# Patient Record
Sex: Female | Born: 1970 | Race: White | Hispanic: No | Marital: Married | Smoking: Former smoker
Health system: Southern US, Community
[De-identification: ages and names within clinical notes are randomized; demographics above are authoritative.]

## PROBLEM LIST (undated history)

## (undated) DIAGNOSIS — N63 Unspecified lump in unspecified breast: Secondary | ICD-10-CM

## (undated) DIAGNOSIS — G43909 Migraine, unspecified, not intractable, without status migrainosus: Secondary | ICD-10-CM

## (undated) DIAGNOSIS — J40 Bronchitis, not specified as acute or chronic: Secondary | ICD-10-CM

## (undated) DIAGNOSIS — T8859XA Other complications of anesthesia, initial encounter: Secondary | ICD-10-CM

## (undated) DIAGNOSIS — Z9889 Other specified postprocedural states: Secondary | ICD-10-CM

## (undated) DIAGNOSIS — E669 Obesity, unspecified: Secondary | ICD-10-CM

## (undated) DIAGNOSIS — R0683 Snoring: Secondary | ICD-10-CM

## (undated) DIAGNOSIS — R112 Nausea with vomiting, unspecified: Secondary | ICD-10-CM

## (undated) DIAGNOSIS — T4145XA Adverse effect of unspecified anesthetic, initial encounter: Secondary | ICD-10-CM

## (undated) DIAGNOSIS — M542 Cervicalgia: Secondary | ICD-10-CM

## (undated) DIAGNOSIS — Z972 Presence of dental prosthetic device (complete) (partial): Secondary | ICD-10-CM

## (undated) DIAGNOSIS — R490 Dysphonia: Secondary | ICD-10-CM

## (undated) DIAGNOSIS — K760 Fatty (change of) liver, not elsewhere classified: Secondary | ICD-10-CM

## (undated) DIAGNOSIS — N3281 Overactive bladder: Secondary | ICD-10-CM

## (undated) DIAGNOSIS — K219 Gastro-esophageal reflux disease without esophagitis: Secondary | ICD-10-CM

## (undated) DIAGNOSIS — E785 Hyperlipidemia, unspecified: Secondary | ICD-10-CM

## (undated) DIAGNOSIS — E039 Hypothyroidism, unspecified: Secondary | ICD-10-CM

## (undated) DIAGNOSIS — D649 Anemia, unspecified: Secondary | ICD-10-CM

## (undated) DIAGNOSIS — G8929 Other chronic pain: Secondary | ICD-10-CM

## (undated) HISTORY — DX: Obesity, unspecified: E66.9

## (undated) HISTORY — DX: Hypothyroidism, unspecified: E03.9

## (undated) HISTORY — DX: Overactive bladder: N32.81

## (undated) HISTORY — DX: Gastro-esophageal reflux disease without esophagitis: K21.9

## (undated) HISTORY — PX: TUBAL LIGATION: SHX77

## (undated) HISTORY — PX: FOOT SURGERY: SHX648

## (undated) HISTORY — DX: Unspecified lump in unspecified breast: N63.0

## (undated) HISTORY — DX: Migraine, unspecified, not intractable, without status migrainosus: G43.909

## (undated) HISTORY — DX: Anemia, unspecified: D64.9

## (undated) HISTORY — DX: Hyperlipidemia, unspecified: E78.5

## (undated) HISTORY — DX: Other chronic pain: G89.29

## (undated) HISTORY — DX: Bronchitis, not specified as acute or chronic: J40

## (undated) HISTORY — DX: Fatty (change of) liver, not elsewhere classified: K76.0

## (undated) HISTORY — DX: Cervicalgia: M54.2

---

## 2001-11-21 ENCOUNTER — Encounter: Payer: Self-pay | Admitting: *Deleted

## 2001-11-21 ENCOUNTER — Emergency Department (HOSPITAL_COMMUNITY): Admission: EM | Admit: 2001-11-21 | Discharge: 2001-11-21 | Payer: Self-pay | Admitting: *Deleted

## 2001-11-28 ENCOUNTER — Encounter: Admission: RE | Admit: 2001-11-28 | Discharge: 2001-12-08 | Payer: Self-pay | Admitting: Family Medicine

## 2002-05-01 ENCOUNTER — Other Ambulatory Visit: Admission: RE | Admit: 2002-05-01 | Discharge: 2002-05-01 | Payer: Self-pay | Admitting: Family Medicine

## 2003-03-13 ENCOUNTER — Ambulatory Visit (HOSPITAL_COMMUNITY): Admission: RE | Admit: 2003-03-13 | Discharge: 2003-03-13 | Payer: Self-pay | Admitting: Family Medicine

## 2003-03-13 ENCOUNTER — Encounter: Payer: Self-pay | Admitting: Family Medicine

## 2004-06-30 ENCOUNTER — Ambulatory Visit: Payer: Self-pay | Admitting: Family Medicine

## 2004-07-04 ENCOUNTER — Encounter: Admission: RE | Admit: 2004-07-04 | Discharge: 2004-07-04 | Payer: Self-pay | Admitting: Family Medicine

## 2004-08-26 ENCOUNTER — Ambulatory Visit: Payer: Self-pay | Admitting: Family Medicine

## 2004-09-02 ENCOUNTER — Other Ambulatory Visit: Admission: RE | Admit: 2004-09-02 | Discharge: 2004-09-02 | Payer: Self-pay | Admitting: Internal Medicine

## 2004-09-02 ENCOUNTER — Ambulatory Visit: Payer: Self-pay | Admitting: Family Medicine

## 2004-11-05 ENCOUNTER — Ambulatory Visit: Payer: Self-pay | Admitting: Family Medicine

## 2005-04-13 ENCOUNTER — Ambulatory Visit: Payer: Self-pay | Admitting: Family Medicine

## 2005-04-16 ENCOUNTER — Encounter: Admission: RE | Admit: 2005-04-16 | Discharge: 2005-04-16 | Payer: Self-pay | Admitting: Family Medicine

## 2006-05-14 ENCOUNTER — Ambulatory Visit: Payer: Self-pay | Admitting: Family Medicine

## 2006-06-01 ENCOUNTER — Ambulatory Visit: Payer: Self-pay | Admitting: Family Medicine

## 2006-06-01 LAB — CONVERTED CEMR LAB
ALT: 21 units/L (ref 0–40)
AST: 27 units/L (ref 0–37)
BUN: 7 mg/dL (ref 6–23)
Basophils Relative: 0.9 % (ref 0.0–1.0)
Calcium: 9.9 mg/dL (ref 8.4–10.5)
Chloride: 107 meq/L (ref 96–112)
Cholesterol: 230 mg/dL (ref 0–200)
Eosinophil percent: 2.7 % (ref 0.0–5.0)
GFR calc non Af Amer: 75 mL/min
HCT: 40.7 % (ref 36.0–46.0)
HDL: 38.1 mg/dL — ABNORMAL LOW (ref >39.0)
Hemoglobin: 13.3 g/dL (ref 12.0–15.0)
Lymphocytes Relative: 41.6 % (ref 12.0–46.0)
MCV: 91.5 fL (ref 78.0–100.0)
Monocytes Absolute: 0.6 10*3/uL (ref 0.2–0.7)
Neutrophils Relative %: 44.8 % (ref 43.0–77.0)
RBC: 4.45 M/uL (ref 3.87–5.11)
Sodium: 143 meq/L (ref 135–145)
WBC: 5.8 10*3/uL (ref 4.5–10.5)

## 2006-06-08 ENCOUNTER — Encounter: Payer: Self-pay | Admitting: Family Medicine

## 2006-06-08 ENCOUNTER — Ambulatory Visit: Payer: Self-pay | Admitting: Family Medicine

## 2006-06-09 ENCOUNTER — Other Ambulatory Visit: Admission: RE | Admit: 2006-06-09 | Discharge: 2006-06-09 | Payer: Self-pay | Admitting: Family Medicine

## 2006-06-23 ENCOUNTER — Ambulatory Visit: Payer: Self-pay | Admitting: Family Medicine

## 2006-07-07 ENCOUNTER — Ambulatory Visit: Payer: Self-pay | Admitting: Family Medicine

## 2007-03-22 ENCOUNTER — Telehealth: Payer: Self-pay | Admitting: Family Medicine

## 2007-04-13 ENCOUNTER — Telehealth: Payer: Self-pay | Admitting: Family Medicine

## 2007-08-18 HISTORY — PX: CHOLECYSTECTOMY: SHX55

## 2007-10-03 ENCOUNTER — Emergency Department (HOSPITAL_COMMUNITY): Admission: EM | Admit: 2007-10-03 | Discharge: 2007-10-03 | Payer: Self-pay | Admitting: Emergency Medicine

## 2007-10-06 ENCOUNTER — Ambulatory Visit: Payer: Self-pay | Admitting: Family Medicine

## 2007-10-06 DIAGNOSIS — E039 Hypothyroidism, unspecified: Secondary | ICD-10-CM | POA: Insufficient documentation

## 2007-10-06 DIAGNOSIS — S139XXA Sprain of joints and ligaments of unspecified parts of neck, initial encounter: Secondary | ICD-10-CM

## 2007-10-06 DIAGNOSIS — E785 Hyperlipidemia, unspecified: Secondary | ICD-10-CM

## 2007-10-11 ENCOUNTER — Encounter: Admission: RE | Admit: 2007-10-11 | Discharge: 2007-11-14 | Payer: Self-pay | Admitting: Family Medicine

## 2007-11-14 ENCOUNTER — Encounter: Payer: Self-pay | Admitting: Family Medicine

## 2007-12-01 ENCOUNTER — Telehealth: Payer: Self-pay | Admitting: Family Medicine

## 2008-01-24 ENCOUNTER — Telehealth: Payer: Self-pay | Admitting: Family Medicine

## 2008-01-31 ENCOUNTER — Telehealth: Payer: Self-pay | Admitting: Family Medicine

## 2008-04-25 ENCOUNTER — Ambulatory Visit: Payer: Self-pay | Admitting: Family Medicine

## 2008-04-25 DIAGNOSIS — M545 Low back pain: Secondary | ICD-10-CM

## 2008-04-26 ENCOUNTER — Telehealth: Payer: Self-pay | Admitting: Family Medicine

## 2008-04-26 ENCOUNTER — Encounter: Admission: RE | Admit: 2008-04-26 | Discharge: 2008-04-26 | Payer: Self-pay | Admitting: Family Medicine

## 2008-06-04 ENCOUNTER — Telehealth: Payer: Self-pay | Admitting: Family Medicine

## 2008-06-08 ENCOUNTER — Telehealth: Payer: Self-pay | Admitting: Family Medicine

## 2008-06-11 ENCOUNTER — Ambulatory Visit: Payer: Self-pay | Admitting: Family Medicine

## 2008-08-06 ENCOUNTER — Telehealth: Payer: Self-pay | Admitting: *Deleted

## 2008-08-15 ENCOUNTER — Telehealth: Payer: Self-pay | Admitting: Family Medicine

## 2008-10-10 ENCOUNTER — Ambulatory Visit: Payer: Self-pay | Admitting: Family Medicine

## 2008-10-12 ENCOUNTER — Telehealth: Payer: Self-pay | Admitting: Family Medicine

## 2008-10-17 ENCOUNTER — Telehealth: Payer: Self-pay | Admitting: Family Medicine

## 2008-10-26 ENCOUNTER — Telehealth: Payer: Self-pay | Admitting: Family Medicine

## 2008-11-22 ENCOUNTER — Telehealth: Payer: Self-pay | Admitting: Family Medicine

## 2008-11-28 ENCOUNTER — Encounter: Payer: Self-pay | Admitting: Family Medicine

## 2009-01-11 ENCOUNTER — Telehealth: Payer: Self-pay | Admitting: Family Medicine

## 2009-03-25 ENCOUNTER — Telehealth: Payer: Self-pay | Admitting: Family Medicine

## 2009-05-28 ENCOUNTER — Ambulatory Visit: Payer: Self-pay | Admitting: Family Medicine

## 2009-05-31 ENCOUNTER — Ambulatory Visit: Payer: Self-pay | Admitting: Family Medicine

## 2009-05-31 DIAGNOSIS — N959 Unspecified menopausal and perimenopausal disorder: Secondary | ICD-10-CM | POA: Insufficient documentation

## 2009-05-31 DIAGNOSIS — N318 Other neuromuscular dysfunction of bladder: Secondary | ICD-10-CM | POA: Insufficient documentation

## 2009-05-31 LAB — CONVERTED CEMR LAB
Bilirubin Urine: NEGATIVE
Ketones, urine, test strip: NEGATIVE
Specific Gravity, Urine: <1.005
Urobilinogen, UA: 0.2

## 2009-06-21 ENCOUNTER — Telehealth: Payer: Self-pay | Admitting: Family Medicine

## 2009-07-15 ENCOUNTER — Ambulatory Visit: Payer: Self-pay | Admitting: Family Medicine

## 2009-07-17 LAB — CONVERTED CEMR LAB
Albumin: 3.8 g/dL (ref 3.5–5.2)
Alkaline Phosphatase: 72 units/L (ref 39–117)
Basophils Absolute: 0.1 10*3/uL (ref 0.0–0.1)
Bilirubin, Direct: 0 mg/dL (ref 0.0–0.3)
CO2: 25 meq/L (ref 19–32)
Calcium: 9.3 mg/dL (ref 8.4–10.5)
Cholesterol: 248 mg/dL — ABNORMAL HIGH (ref 0–200)
Creatinine, Ser: 0.8 mg/dL (ref 0.4–1.2)
Eosinophils Absolute: 0.2 10*3/uL (ref 0.0–0.7)
Glucose, Bld: 85 mg/dL (ref 70–99)
Lymphocytes Relative: 39.4 % (ref 12.0–46.0)
MCHC: 33.3 g/dL (ref 30.0–36.0)
Neutrophils Relative %: 47.1 % (ref 43.0–77.0)
Platelets: 265 10*3/uL (ref 150.0–400.0)
RDW: 13.9 % (ref 11.5–14.6)
Total CHOL/HDL Ratio: 6
Triglycerides: 222 mg/dL — ABNORMAL HIGH (ref 0.0–149.0)

## 2009-07-19 LAB — CONVERTED CEMR LAB
FSH: 8.1 milliintl units/mL
LH: 4.81 milliintl units/mL

## 2009-07-24 ENCOUNTER — Ambulatory Visit: Payer: Self-pay | Admitting: Family Medicine

## 2009-07-24 ENCOUNTER — Other Ambulatory Visit: Admission: RE | Admit: 2009-07-24 | Discharge: 2009-07-24 | Payer: Self-pay | Admitting: Family Medicine

## 2009-07-24 DIAGNOSIS — E663 Overweight: Secondary | ICD-10-CM | POA: Insufficient documentation

## 2009-07-24 DIAGNOSIS — N63 Unspecified lump in unspecified breast: Secondary | ICD-10-CM

## 2009-07-29 ENCOUNTER — Encounter: Payer: Self-pay | Admitting: Family Medicine

## 2009-07-31 ENCOUNTER — Encounter: Admission: RE | Admit: 2009-07-31 | Discharge: 2009-07-31 | Payer: Self-pay | Admitting: Family Medicine

## 2009-08-08 ENCOUNTER — Telehealth: Payer: Self-pay | Admitting: Family Medicine

## 2009-10-18 ENCOUNTER — Telehealth: Payer: Self-pay | Admitting: Family Medicine

## 2009-10-29 ENCOUNTER — Telehealth: Payer: Self-pay | Admitting: Family Medicine

## 2009-11-22 ENCOUNTER — Telehealth: Payer: Self-pay

## 2009-11-27 ENCOUNTER — Telehealth: Payer: Self-pay | Admitting: Family Medicine

## 2010-03-17 ENCOUNTER — Telehealth: Payer: Self-pay | Admitting: Family Medicine

## 2010-05-05 ENCOUNTER — Encounter: Admission: RE | Admit: 2010-05-05 | Discharge: 2010-05-05 | Payer: Self-pay | Admitting: Internal Medicine

## 2010-06-12 ENCOUNTER — Telehealth: Payer: Self-pay | Admitting: Family Medicine

## 2010-09-16 NOTE — Progress Notes (Signed)
Summary: refill hydrocodone  Phone Note From Pharmacy   [facility] [address] [phone]* Call For: Adora Yeh  Summary of Call: refill hydrocodone 5/500 1 by mouth every 6 hours as needed for pain Initial call taken by: Alfred Levins, CMA,  October 18, 2009 3:26 PM  Follow-up for Phone Call        call in #60 with 2 rf Follow-up by: Nelwyn Salisbury MD,  October 18, 2009 4:10 PM  Additional Follow-up for Phone Call Additional follow up Details #1::        Phone call completed, Pharmacist called Additional Follow-up by: Alfred Levins, CMA,  October 18, 2009 4:13 PM    Prescriptions: VICODIN 5-500 MG  TABS (HYDROCODONE-ACETAMINOPHEN) 1 every 6 hours as needed for pain  #60 x 2   Entered by:   Alfred Levins, CMA   Authorized by:   Nelwyn Salisbury MD   Signed by:   Alfred Levins, CMA on 10/18/2009   Method used:   Telephoned to ...       [facility] [address] [phone]* (retail)       [address]       [place], [place]  [address]       Ph: [phone] or [phone]       Fax: [phone]   RxID:   [id number]

## 2010-09-16 NOTE — Progress Notes (Signed)
Summary:  stronger med  Phone Note Call from Patient Call back at Home Phone [phone]   Caller: Mom Call For: Nelwyn Salisbury MD Summary of Call: pt would like something stronger than vicodin for hip/arm/elbow pain [facility] [phone] Initial call taken by: Heron Sabins,  October 29, 2009 12:45 PM  Follow-up for Phone Call        No I cannot do that. Since her joints are bothering her so much, I suggest refering her to Rheumatology if she wishes Follow-up by: Nelwyn Salisbury MD,  October 29, 2009 3:21 PM  Additional Follow-up for Phone Call Additional follow up Details #1::        called and LMOM. Additional Follow-up by: Raechel Ache, RN,  October 29, 2009 3:48 PM

## 2010-09-16 NOTE — Progress Notes (Signed)
Summary: refill hydrocodone   Phone Note From Pharmacy   Call[facility] [address] [phone]* Summary of Call: refill hydrocodone 5-500mg   Initial call taken by: Pura Spice, RN,  June 12, 2010 2:36 PM  Follow-up for Phone Call        call in #60 with 2 rf Follow-up by: Nelwyn Salisbury MD,  June 13, 2010 1:12 PM  Additional Follow-up for Phone Call Additional follow up Details #1::        done Additional Follow-up by: Pura Spice, RN,  June 13, 2010 1:21 PM    Prescriptions: VICODIN 5-500 MG  TABS (HYDROCODONE-ACETAMINOPHEN) 1 every 6 hours as needed for pain  #60 x 2   Entered by:   Pura Spice, RN   Authorized by:   Nelwyn Salisbury MD   Signed by:   Pura Spice, RN on 06/13/2010   Method used:   Telephoned to ...       [facility] [address] [phone]* (retail)       [address]       [place], [place]  [address]       Ph: [phone] or [phone]       Fax: [phone]   RxID:   [id number]

## 2010-09-16 NOTE — Progress Notes (Signed)
Summary: refill Vicodin  Phone Note From Pharmacy Message from:  Fax from Pharmacy on November 27, 2009 4:40 PM  Refills Requested: Medication #1:  VICODIN 5-500 MG  TABS 1 every 6 hours as needed for pain   Dosage confirmed as above?Dosage Confirmed   Supply Requested: 1 month   Last Refilled: 11/15/2009  Method Requested: Telephone to Pharmacy Initial call taken by: Raechel Ache, RN,  November 27, 2009 4:41 PM [facility] [address] [phone]* Summary of Call: No, I gave her #60 with 2 rf just six weeks ago Initial call taken by: Nelwyn Salisbury MD,  November 27, 2009 5:15 PM  Follow-up for Phone Call        pharmacy notified. Follow-up by: Raechel Ache, RN,  November 28, 2009 8:02 AM

## 2010-09-16 NOTE — Progress Notes (Signed)
Summary: Pt has questions re: Phentermine dosage amounts  Phone Note Call from Patient Call back at Home Phone [phone] Call back at call before 1:45pm or leave a message   Caller: Patient Summary of Call: Pt called and said that she has been getting her Phentermine script filled at [facility] [address] Pt says that the mg dose of the med that [facility] has been giving her is 37.5 mg and it has been tablets. Today when she went to pick up refill at [facility], they gave her a 30mg  capsule. Pt is concerned about the changes in dosage amount and form of pill.   Initial call taken by: Lucy Antigua,  November 22, 2009 12:02 PM  Follow-up for Phone Call        attempt to call - ans mach - LMTCB if questions - rx was written for 30mg  - check with pharm about where 37.5 mg came from. KIK Follow-up by: Duard Brady LPN,  November 22, 2009 12:17 PM

## 2010-09-16 NOTE — Progress Notes (Signed)
Summary: RX refill  Phone Note Refill Request Message from:  Fax from Pharmacy on March 17, 2010 1:24 PM  Refills Requested: Medication #1:  VICODIN 5-500 MG  TABS 1 every 6 hours as needed for pain   Dosage confirmed as above?Dosage Confirmed Please advise refill?  Initial call taken by: Josph Macho RMA,  March 17, 2010 1:24 PM  Follow-up for Phone Call        call in #60 with 2 rf Follow-up by: Nelwyn Salisbury MD,  March 18, 2010 1:51 PM    Prescriptions: VICODIN 5-500 MG  TABS (HYDROCODONE-ACETAMINOPHEN) 1 every 6 hours as needed for pain  #60 x 2   Entered by:   Josph Macho RMA   Authorized by:   Nelwyn Salisbury MD   Signed by:   Josph Macho RMA on 03/18/2010   Method used:   Telephoned to ...       [facility] [address] [phone]* (retail)       [address]       [place], [place]  [address]       Ph: [phone] or [phone]       Fax: [phone]   RxID:   [id number]

## 2010-09-16 NOTE — Progress Notes (Signed)
Summary: REQ FOR REFILL  Phone Note Call from Patient   Caller: Patient Reason for Call: Refill Medication Summary of Call: PT CALLED TO REQ A REFILL FOR MED (VICODIN / HYDROCODONE)....  ADV THAT IF YOU HAVE ANY QUESTIONS OR CONCERNS, YOU CAN REACH HER @ [phone]. // RS Initial call taken by: Debbra Riding,  June 21, 2009 8:57 AM  Follow-up for Phone Call        call in #60 with 2 rf Follow-up by: Nelwyn Salisbury MD,  June 21, 2009 10:29 AM  Additional Follow-up for Phone Call Additional follow up Details #1::        Phone Call Completed, Rx Called In Additional Follow-up by: Alfred Levins, CMA,  June 21, 2009 1:59 PM    Prescriptions: VICODIN 5-500 MG  TABS (HYDROCODONE-ACETAMINOPHEN) 1 every 6 hours as needed for pain  #60 x 2   Entered by:   Alfred Levins, CMA   Authorized by:   Nelwyn Salisbury MD   Signed by:   Alfred Levins, CMA on 06/21/2009   Method used:   Telephoned to ...       [facility]  [facility] [address] [phone]* (retail)       [address]       [place], [place]  [address]       Ph: [phone] or [phone]       Fax: [phone]   RxID:   [id number]

## 2010-09-23 ENCOUNTER — Other Ambulatory Visit: Payer: Self-pay | Admitting: Family Medicine

## 2010-09-23 DIAGNOSIS — R52 Pain, unspecified: Secondary | ICD-10-CM

## 2010-09-23 NOTE — Telephone Encounter (Signed)
Called to [facility] 

## 2010-09-23 NOTE — Telephone Encounter (Signed)
Call in #30 with 5 rf 

## 2010-11-11 ENCOUNTER — Other Ambulatory Visit: Payer: Self-pay | Admitting: Family Medicine

## 2010-11-12 NOTE — Telephone Encounter (Signed)
No, I gave her #30 with 5 rf last month. She should not be using more than 30 in a month

## 2010-11-12 NOTE — Telephone Encounter (Signed)
[facility] notified  Of denial of med.

## 2010-11-17 ENCOUNTER — Other Ambulatory Visit: Payer: Self-pay

## 2010-11-17 DIAGNOSIS — R52 Pain, unspecified: Secondary | ICD-10-CM

## 2010-11-17 NOTE — Telephone Encounter (Signed)
Requesting refills vicodin 5-500 [facility]

## 2010-11-18 NOTE — Telephone Encounter (Signed)
rx request for vicodin denied  And [facility]ed [facility]

## 2010-11-18 NOTE — Telephone Encounter (Signed)
She needs an OV first. I have not seen her for well over a year

## 2010-11-25 ENCOUNTER — Other Ambulatory Visit: Payer: Self-pay | Admitting: Family Medicine

## 2010-12-08 ENCOUNTER — Other Ambulatory Visit: Payer: Self-pay | Admitting: Family Medicine

## 2010-12-15 ENCOUNTER — Other Ambulatory Visit: Payer: Self-pay | Admitting: Family Medicine

## 2010-12-16 ENCOUNTER — Other Ambulatory Visit: Payer: Self-pay | Admitting: Family Medicine

## 2010-12-16 NOTE — Telephone Encounter (Signed)
Pt called and said that she no longer has insurance and pts husband just started a new job. Pt does not have money to come in for ov. Pt is req a refill of Hydrocodone 5-500 mg.  [facility] [address]

## 2010-12-17 ENCOUNTER — Telehealth: Payer: Self-pay | Admitting: Family Medicine

## 2010-12-17 NOTE — Telephone Encounter (Signed)
LMOM explaining that we are aware of her situation [no insurance], but unfortunately we cannot prescribe Rx; especially of controlled substances, for Pts unless they have had an OV w/i the past year. Advised Pt that she needs to call and schedule an OV for med assessment. Left contact name & number for callback.

## 2010-12-17 NOTE — Telephone Encounter (Signed)
As noted in the chart, we cannot do this without an OV. The patient is aware

## 2011-01-02 NOTE — Assessment & Plan Note (Signed)
[facility] OFFICE NOTE   Marie, Tran                   MRN:          [id number]  DATE:06/08/2006                            DOB:          1971-02-10    This is a 40 year old woman here for a complete physical examination.  In  general, she is doing fairly well.  Last month when I saw her, I gave her a  prescription for Meridia to help her lose weight.  She said this was too  expensive and decided not to use it.  Instead, she has been walking and  trying to diet responsibly.  Otherwise, no particular issues.   PAST MEDICAL HISTORY:  Includes hypothyroidism and intermittent back pains.  She did have her initial screening mammogram in November of 2005, and this  was normal.  For details of her past medical history, family history, social  history, habits, Melvern Banker, refer to her last physical note dated September 02, 2004.   ALLERGIES:  None.   CURRENT MEDICATIONS:  1. Synthroid 125 mcg per day.  2. Vitamin E daily.  3. Hydrochlorothiazide 25 mg once a day as needed for edema.  4. Vicodin 5/500 as needed for back pain.   OBJECTIVE:  Height 5 feet 5 inches.  Weight 250 pounds (which is down 4  pounds form our last visit).  Pulse 76 and regular.  BP 102/72.  GENERAL:  She is quite obese.  SKIN:  Clear.  EYES:  Clear.  Sclerae clear.  OROPHARYNX:  Clear.  NECK:  Supple without lymphadenopathy or masses.  LUNGS:  Clear.  CARDIAC:  Rate and rhythm are regular without gallops, murmurs, or rubs.  Distal pulses full.  BREASTS AND AXILLAE:  Clear.  ABDOMEN:  Soft.  Normal bowel sounds.  Nontender.  No masses.  PELVIC:  External genitalia within normal limits.  Vaginal clear.  Cervix is  clear.  Pap smear is obtained.  Uterus is not enlarged.  No adnexal mass or  tenderness.  EXTREMITIES:  No cyanosis, clubbing, or edema.  NEUROLOGIC:  Grossly intact.   She was here for fasting labs on October 16.   This is remarkable for a  normal TSH but an abnormal lipid panel as usual.  Total cholesterol is high  at 230.  Triglycerides high at 162.  LDL is high at 163 with HDL low at 38.   ASSESSMENT AND PLAN:  1. Complete physical.  We talked about continuing her diet and exercise      plan to lose weight.  2. Hypothyroidism, stable.  3. Hyperlipidemia.  Will begin simvastatin 40 mg once daily and will check      a lipid panel again in 3 months.            ______________________________  Tera Mater. Clent Ridges, MD     SAF/MedQ  DD:  06/08/2006  DT:  06/09/2006  Job #:  [id number]

## 2011-01-22 ENCOUNTER — Encounter: Payer: Self-pay | Admitting: Family Medicine

## 2011-01-22 ENCOUNTER — Ambulatory Visit (INDEPENDENT_AMBULATORY_CARE_PROVIDER_SITE_OTHER): Payer: Self-pay | Admitting: Family Medicine

## 2011-01-22 VITALS — BP 128/92 | HR 95 | Ht 64.0 in | Wt 254.0 lb

## 2011-01-22 DIAGNOSIS — M5440 Lumbago with sciatica, unspecified side: Secondary | ICD-10-CM

## 2011-01-22 DIAGNOSIS — M5430 Sciatica, unspecified side: Secondary | ICD-10-CM

## 2011-01-22 MED ORDER — OXYCODONE-ACETAMINOPHEN 10-325 MG PO TABS: 1.0000 | ORAL_TABLET | Freq: Four times a day (QID) | ORAL | Status: DC | PRN

## 2011-01-22 NOTE — Progress Notes (Signed)
  Subjective:    Patient ID: Marie Tran, female    DOB: 29-Nov-1970, 40 y.o.   MRN: [id number]  HPI Here for worsening low back pain. We got an MRI of the lumbar spine in 2009 showing multilevel disc herniations but no obvious nerve impingement. She has dealt with this since then with Vicodin and heat. In the past 3 months however the pain has become more severe, and she has radiation of severe pain down the right leg to the foot. The right leg also has weakness and numbness now.    Review of Systems  Constitutional: Negative.   Musculoskeletal: Positive for back pain.       Objective:   Physical Exam  Constitutional:       In pain, walks with a limp  Musculoskeletal:       Very tender in the lower back an dover the right sciatic notch. Positive SLR bilaterally          Assessment & Plan:  Low back pain, probably due to disc herniations. Try Percocet for pain. Her medical insurance (she is on her husband's plan) will become effective in 30 days. We will plan to set up another lumbar MRI after this kicks in.

## 2011-01-28 ENCOUNTER — Other Ambulatory Visit: Payer: Self-pay | Admitting: Family Medicine

## 2011-02-05 NOTE — Telephone Encounter (Signed)
Call in #30 with 5 rf 

## 2011-02-06 MED ORDER — PHENTERMINE HCL 30 MG PO CAPS: 30.0000 mg | ORAL_CAPSULE | Freq: Every day | ORAL | Status: DC

## 2011-02-09 ENCOUNTER — Ambulatory Visit (INDEPENDENT_AMBULATORY_CARE_PROVIDER_SITE_OTHER): Payer: Self-pay | Admitting: Family Medicine

## 2011-02-09 DIAGNOSIS — Z0000 Encounter for general adult medical examination without abnormal findings: Secondary | ICD-10-CM

## 2011-02-17 ENCOUNTER — Other Ambulatory Visit: Payer: Self-pay | Admitting: Family Medicine

## 2011-02-17 NOTE — Telephone Encounter (Signed)
Call in #60 with 5 rf 

## 2011-02-19 ENCOUNTER — Telehealth: Payer: Self-pay | Admitting: *Deleted

## 2011-02-19 NOTE — Telephone Encounter (Signed)
Rx called in 

## 2011-02-19 NOTE — Telephone Encounter (Signed)
Refill on vicodin 5/500 last filled on 10/27/2010 LOV 01/22/11

## 2011-02-20 MED ORDER — HYDROCODONE-ACETAMINOPHEN 5-500 MG PO TABS: 5.0000 | ORAL_TABLET | Freq: Four times a day (QID) | ORAL | Status: DC | PRN

## 2011-02-20 NOTE — Telephone Encounter (Signed)
Call in #120 with no rf 

## 2011-04-09 ENCOUNTER — Encounter: Payer: Self-pay | Admitting: Family Medicine

## 2011-04-09 ENCOUNTER — Ambulatory Visit (INDEPENDENT_AMBULATORY_CARE_PROVIDER_SITE_OTHER): Payer: Self-pay | Admitting: Family Medicine

## 2011-04-09 VITALS — BP 122/78 | HR 93 | Temp 98.6°F | Wt 242.0 lb

## 2011-04-09 DIAGNOSIS — J40 Bronchitis, not specified as acute or chronic: Secondary | ICD-10-CM

## 2011-04-09 MED ORDER — AZITHROMYCIN 250 MG PO TABS: ORAL_TABLET | ORAL | Status: AC

## 2011-04-09 MED ORDER — HYDROCODONE-HOMATROPINE 5-1.5 MG/5ML PO SYRP: 5.0000 mL | ORAL_SOLUTION | ORAL | Status: AC | PRN

## 2011-04-09 NOTE — Progress Notes (Signed)
  Subjective:    Patient ID: Marie Tran, female    DOB: 29-Aug-1970, 40 y.o.   MRN: [id number]  HPI Here for 10 days of sinus pressure, PND, chest congestion, and coughing up yellow sputum. No fever. On Mucinex   Review of Systems  Constitutional: Negative.   HENT: Positive for congestion, postnasal drip and sinus pressure.   Eyes: Negative.   Respiratory: Positive for cough.        Objective:   Physical Exam  Constitutional: She appears well-developed and well-nourished.  HENT:  Right Ear: External ear normal.  Left Ear: External ear normal.  Nose: Nose normal.  Mouth/Throat: Oropharynx is clear and moist. No oropharyngeal exudate.  Eyes: Conjunctivae are normal. Pupils are equal, round, and reactive to light.  Neck: Normal range of motion. Neck supple. No thyromegaly present.  Pulmonary/Chest: Effort normal. No respiratory distress. She has no wheezes. She has no rales. She exhibits no tenderness.       Diffuse rhonchi   Lymphadenopathy:    She has no cervical adenopathy.          Assessment & Plan:  Rest and drink fluids

## 2011-05-07 ENCOUNTER — Telehealth: Payer: Self-pay | Admitting: Family Medicine

## 2011-05-07 NOTE — Telephone Encounter (Signed)
Refill request for Hydrocodone/Acetaminophen 5-500 mg take 1 po q6hrs prn. Pt last here on 01/22/11 and script last filled on 06/25/10.

## 2011-05-08 MED ORDER — HYDROCODONE-ACETAMINOPHEN 5-500 MG PO TABS: 5.0000 | ORAL_TABLET | Freq: Four times a day (QID) | ORAL | Status: DC | PRN

## 2011-05-08 NOTE — Telephone Encounter (Signed)
Call in #120 with 2 rf 

## 2011-05-08 NOTE — Telephone Encounter (Signed)
rx called into pharmacy

## 2011-07-30 ENCOUNTER — Telehealth: Payer: Self-pay | Admitting: Family Medicine

## 2011-07-30 ENCOUNTER — Other Ambulatory Visit: Payer: Self-pay | Admitting: Family Medicine

## 2011-07-30 NOTE — Telephone Encounter (Signed)
Mass was found in pt right breast in December 2010 it was confirmed with an [place] and pt is currently experiencing pains in her breast and would like to be referred for a mammogram. Pt contacted [facility] but was told she could not set up directly and need to be referred.

## 2011-07-30 NOTE — Telephone Encounter (Signed)
She needs an OV. I need to examine her breast before I can send such an order

## 2011-07-30 NOTE — Telephone Encounter (Signed)
lmom for pt to call back and schedule

## 2011-08-04 ENCOUNTER — Encounter: Payer: Self-pay | Admitting: Family Medicine

## 2011-08-04 ENCOUNTER — Ambulatory Visit (INDEPENDENT_AMBULATORY_CARE_PROVIDER_SITE_OTHER): Payer: BC Managed Care – PPO | Admitting: Family Medicine

## 2011-08-04 VITALS — BP 132/86 | HR 94 | Temp 98.6°F | Wt 244.0 lb

## 2011-08-04 DIAGNOSIS — N760 Acute vaginitis: Secondary | ICD-10-CM

## 2011-08-04 DIAGNOSIS — M545 Low back pain, unspecified: Secondary | ICD-10-CM

## 2011-08-04 DIAGNOSIS — N63 Unspecified lump in unspecified breast: Secondary | ICD-10-CM

## 2011-08-04 DIAGNOSIS — L739 Follicular disorder, unspecified: Secondary | ICD-10-CM

## 2011-08-04 DIAGNOSIS — B9689 Other specified bacterial agents as the cause of diseases classified elsewhere: Secondary | ICD-10-CM

## 2011-08-04 DIAGNOSIS — L738 Other specified follicular disorders: Secondary | ICD-10-CM

## 2011-08-04 DIAGNOSIS — A499 Bacterial infection, unspecified: Secondary | ICD-10-CM

## 2011-08-04 MED ORDER — METRONIDAZOLE 500 MG PO TABS: 500.0000 mg | ORAL_TABLET | Freq: Three times a day (TID) | ORAL | Status: AC

## 2011-08-04 NOTE — Progress Notes (Signed)
  Subjective:    Patient ID: Marie Tran, female    DOB: May 31, 1971, 40 y.o.   MRN: [id number]  HPI Here for several reasons. First she needs another mammogram and [place] to follow up a right breast lump she has had for several years. This was last done on 07-21-09, and it was revealed to be an intramammary lymph node. Due to a lack of insurance she has not followed up since then. Now she has insurance again and she wants to get back on track. The lump gets painful from time to time, and she is interested in having it removed. Second, for about 3 months she has had a recurrent vaginal DC which has a yellow color and a foul odor. No pain or itching. She uses a douche from time to time. Lastly she has an irritated rash at the hairline on the back of her head that comes and goes.    Review of Systems  Constitutional: Negative.   Genitourinary: Positive for vaginal discharge. Negative for vaginal bleeding and vaginal pain.       Objective:   Physical Exam  Constitutional: She appears well-developed and well-nourished.  Genitourinary:       The right breast has a tender lump at the 11 o'clock position. No axillary masses. The left breast is clear.   Skin:       The posterior hairline has a red maculopapular rash           Assessment & Plan:  We will set up a bilateral mammogram and right breast US soon. After that we may refer her to Surgery to consider a lumpectomy. Treat the bacterial vaginitis with Flagyl. This may treat the folliculitis on the scalp as well. She is set for a cpx and a pelvic exam next February.

## 2011-08-06 ENCOUNTER — Telehealth: Payer: Self-pay

## 2011-08-06 MED ORDER — PROMETHAZINE HCL 25 MG PO TABS: 25.0000 mg | ORAL_TABLET | Freq: Three times a day (TID) | ORAL | Status: DC | PRN

## 2011-08-06 MED ORDER — PROMETHAZINE HCL 25 MG PO TABS: 25.0000 mg | ORAL_TABLET | ORAL | Status: AC | PRN

## 2011-08-06 NOTE — Telephone Encounter (Signed)
Script called in, tried to call pt no answer. 

## 2011-08-06 NOTE — Telephone Encounter (Signed)
Call in Phenergan 25 mg tabs q 4 hours prn nausea, #60 with 2 rf

## 2011-08-06 NOTE — Telephone Encounter (Signed)
Pt states that every one in her house has the flu and she is experiencing some nausea.  [facility] [address]  Pls advise.

## 2011-08-07 ENCOUNTER — Ambulatory Visit
Admission: RE | Admit: 2011-08-07 | Discharge: 2011-08-07 | Disposition: A | Discharge status: Home / Self Care | Payer: BC Managed Care – PPO | Source: Ambulatory Visit | Attending: Family Medicine | Admitting: Family Medicine

## 2011-08-07 DIAGNOSIS — M545 Low back pain: Secondary | ICD-10-CM

## 2011-08-14 ENCOUNTER — Telehealth: Payer: Self-pay

## 2011-08-14 NOTE — Telephone Encounter (Signed)
Triage VM: Pt had the flu going around on Christmas in the household. Pt states she now has a cough and needs an rx sent to pharmacy.    Called pt to get more information and to make aware Dr. Clent Ridges is out of the office until 08/17/11. Left a message for return call.

## 2011-08-14 NOTE — Telephone Encounter (Signed)
Refill request for Vicodin 5-500 mg take 1 po q6hrs prn and pt last here 08/04/11.

## 2011-08-19 ENCOUNTER — Ambulatory Visit
Admission: RE | Admit: 2011-08-19 | Discharge: 2011-08-19 | Disposition: A | Discharge status: Home / Self Care | Payer: BC Managed Care – PPO | Source: Ambulatory Visit | Attending: Family Medicine | Admitting: Family Medicine

## 2011-08-19 ENCOUNTER — Other Ambulatory Visit: Payer: Self-pay | Admitting: Family Medicine

## 2011-08-19 DIAGNOSIS — N63 Unspecified lump in unspecified breast: Secondary | ICD-10-CM

## 2011-08-19 NOTE — Telephone Encounter (Signed)
Call in #120 with 2 rf 

## 2011-08-20 MED ORDER — HYDROCODONE-ACETAMINOPHEN 5-500 MG PO TABS: 5.0000 | ORAL_TABLET | Freq: Four times a day (QID) | ORAL | Status: DC | PRN

## 2011-08-20 NOTE — Telephone Encounter (Signed)
Script called in

## 2011-08-24 ENCOUNTER — Telehealth: Payer: Self-pay | Admitting: Family Medicine

## 2011-08-24 NOTE — Telephone Encounter (Signed)
Had a mammogram and diagnostic ultrasound. Please advise of the results. They did find a lump. Thanks.

## 2011-08-24 NOTE — Telephone Encounter (Signed)
She needs to get these results from the facility that did them

## 2011-08-25 NOTE — Telephone Encounter (Signed)
Sorry Dr Clent Ridges. She did get the results. She just want to know from you, what would be the next step? She stated that mom had a history of breast cancer. Thanks.

## 2011-08-25 NOTE — Telephone Encounter (Signed)
I did review the results and the lump seems to be a simple cyst. I suggest she get a repeat mammogram and [place] in 6 months (as they suggested)

## 2011-08-26 NOTE — Telephone Encounter (Signed)
Spoke with pt and she will discuss further when she comes in for her office visit.

## 2011-09-18 ENCOUNTER — Other Ambulatory Visit (INDEPENDENT_AMBULATORY_CARE_PROVIDER_SITE_OTHER): Payer: BC Managed Care – PPO

## 2011-09-18 DIAGNOSIS — Z0000 Encounter for general adult medical examination without abnormal findings: Secondary | ICD-10-CM

## 2011-09-18 LAB — CBC WITH DIFFERENTIAL/PLATELET
Eosinophils Relative: 1.9 % (ref 0.0–5.0)
Lymphocytes Relative: 40.7 % (ref 12.0–46.0)
MCV: 83 fl (ref 78.0–100.0)
Monocytes Absolute: 0.6 10*3/uL (ref 0.1–1.0)
Monocytes Relative: 9.2 % (ref 3.0–12.0)
Neutrophils Relative %: 47.6 % (ref 43.0–77.0)
Platelets: 302 10*3/uL (ref 150.0–400.0)
RBC: 4.44 Mil/uL (ref 3.87–5.11)
WBC: 6.6 10*3/uL (ref 4.5–10.5)

## 2011-09-18 LAB — TSH: TSH: 0.04 u[IU]/mL — ABNORMAL LOW (ref 0.35–5.50)

## 2011-09-18 LAB — POCT URINALYSIS DIPSTICK
Blood, UA: NEGATIVE
Glucose, UA: NEGATIVE
Nitrite, UA: NEGATIVE
Urobilinogen, UA: 0.2
pH, UA: 6

## 2011-09-18 LAB — BASIC METABOLIC PANEL
BUN: 8 mg/dL (ref 6–23)
Calcium: 9.4 mg/dL (ref 8.4–10.5)
Creatinine, Ser: 0.7 mg/dL (ref 0.4–1.2)
GFR: 97.83 mL/min (ref >60.00)

## 2011-09-18 LAB — LIPID PANEL
Cholesterol: 211 mg/dL — ABNORMAL HIGH (ref 0–200)
HDL: 47.3 mg/dL (ref >39.00)
Total CHOL/HDL Ratio: 4
Triglycerides: 103 mg/dL (ref 0.0–149.0)

## 2011-09-18 LAB — HEPATIC FUNCTION PANEL
AST: 24 U/L (ref 0–37)
Albumin: 3.8 g/dL (ref 3.5–5.2)

## 2011-09-22 NOTE — Progress Notes (Signed)
Quick Note:  Left voice message, pt has office visit here on 09/25/11 and I will ask where to send new script. ______

## 2011-09-23 ENCOUNTER — Telehealth: Payer: Self-pay | Admitting: Family Medicine

## 2011-09-23 NOTE — Telephone Encounter (Signed)
[facility] refill for #120 of her Vicodin
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
## 2011-09-23 NOTE — Telephone Encounter (Signed)
Script called in and spoke with pt. 

## 2011-09-23 NOTE — Telephone Encounter (Signed)
Pts spouse called to check on status of getting pts Vicodin called in. Pts spouse says that they are leaving town at 2pm today and need this called in prior to leaving. [facility] [address]

## 2011-09-25 ENCOUNTER — Encounter: Payer: Self-pay | Admitting: Family Medicine

## 2011-09-25 ENCOUNTER — Ambulatory Visit (INDEPENDENT_AMBULATORY_CARE_PROVIDER_SITE_OTHER): Payer: BC Managed Care – PPO | Admitting: Family Medicine

## 2011-09-25 VITALS — BP 124/78 | HR 103 | Temp 98.2°F | Ht 65.25 in | Wt 248.0 lb

## 2011-09-25 DIAGNOSIS — Z0000 Encounter for general adult medical examination without abnormal findings: Secondary | ICD-10-CM

## 2011-09-25 DIAGNOSIS — R21 Rash and other nonspecific skin eruption: Secondary | ICD-10-CM

## 2011-09-25 MED ORDER — PHENTERMINE HCL 30 MG PO CAPS: 30.0000 mg | ORAL_CAPSULE | Freq: Every day | ORAL | Status: DC

## 2011-09-25 MED ORDER — ATORVASTATIN CALCIUM 40 MG PO TABS: 40.0000 mg | ORAL_TABLET | Freq: Every day | ORAL | Status: DC

## 2011-09-25 MED ORDER — LEVOTHYROXINE SODIUM 150 MCG PO TABS: 150.0000 ug | ORAL_TABLET | Freq: Every day | ORAL | Status: DC

## 2011-09-25 NOTE — Progress Notes (Signed)
  Subjective:    Patient ID: Marie Tran, female    DOB: 1971/05/24, 41 y.o.   MRN: [id number]  HPI 41 yr old female for a cpx. She has one concern today. She still has an itchy rash on the back of her scalp. She is on her menses today so we will not be able to do a Pap smear.    Review of Systems  Constitutional: Negative.   HENT: Negative.   Eyes: Negative.   Respiratory: Negative.   Cardiovascular: Negative.   Gastrointestinal: Negative.   Genitourinary: Negative for dysuria, urgency, frequency, hematuria, flank pain, decreased urine volume, enuresis, difficulty urinating, pelvic pain and dyspareunia.  Musculoskeletal: Negative.   Skin: Negative.   Neurological: Negative.   Hematological: Negative.   Psychiatric/Behavioral: Negative.        Objective:   Physical Exam  Constitutional: She is oriented to person, place, and time. She appears well-developed and well-nourished. No distress.  HENT:  Head: Normocephalic and atraumatic.  Right Ear: External ear normal.  Left Ear: External ear normal.  Nose: Nose normal.  Mouth/Throat: Oropharynx is clear and moist. No oropharyngeal exudate.  Eyes: Conjunctivae and EOM are normal. Pupils are equal, round, and reactive to light. No scleral icterus.  Neck: Normal range of motion. Neck supple. No JVD present. No thyromegaly present.  Cardiovascular: Normal rate, regular rhythm, normal heart sounds and intact distal pulses.  Exam reveals no gallop and no friction rub.   No murmur heard. Pulmonary/Chest: Effort normal and breath sounds normal. No respiratory distress. She has no wheezes. She has no rales. She exhibits no tenderness.  Abdominal: Soft. Bowel sounds are normal. She exhibits no distension and no mass. There is no tenderness. There is no rebound and no guarding.  Musculoskeletal: Normal range of motion. She exhibits no edema and no tenderness.  Lymphadenopathy:    She has no cervical adenopathy.  Neurological: She is  alert and oriented to person, place, and time. She has normal reflexes. No cranial nerve deficit. She exhibits normal muscle tone. Coordination normal.  Skin: Skin is warm and dry. No rash noted. No erythema.  Psychiatric: She has a normal mood and affect. Her behavior is normal. Judgment and thought content normal.          Assessment & Plan:  Well exam. Change the dose of Syntrhoid and recheck in 90 days. Change from Zocor to Lipitor. Refer to dermatology. Exercise and lose some weight.

## 2011-10-02 ENCOUNTER — Other Ambulatory Visit: Payer: Self-pay | Admitting: Family Medicine

## 2011-10-05 ENCOUNTER — Telehealth: Payer: Self-pay | Admitting: Family Medicine

## 2011-10-05 DIAGNOSIS — N63 Unspecified lump in unspecified breast: Secondary | ICD-10-CM

## 2011-10-05 NOTE — Telephone Encounter (Signed)
Patient states that she spoke with you about a possible referral to a surgeon for her breast issues. Patient has spoken with husband and WOULD like to go ahead and get a referral to a surgeon for a 2nd opinion. Please advise.

## 2011-10-06 NOTE — Telephone Encounter (Signed)
Spoke with pt

## 2011-10-06 NOTE — Telephone Encounter (Signed)
Tell her that I have done the referral and Terri will call her about the details

## 2011-10-16 ENCOUNTER — Telehealth: Payer: Self-pay | Admitting: Family Medicine

## 2011-10-16 NOTE — Telephone Encounter (Signed)
Refill request for Vicodin 5-500 mg take 1 po q6hrs prn and pt last here on 09/25/11.

## 2011-10-19 NOTE — Telephone Encounter (Signed)
Left voice message.

## 2011-10-19 NOTE — Telephone Encounter (Signed)
NO refills. I cannot believe she has used #360 of these in just 2 months

## 2011-10-20 ENCOUNTER — Other Ambulatory Visit: Payer: Self-pay

## 2011-10-20 NOTE — Telephone Encounter (Signed)
Left message, no refill per Dr. Clent Ridges, advised pt to call and schedule a office visit.

## 2011-10-20 NOTE — Telephone Encounter (Signed)
See my answer from yesterday

## 2011-10-20 NOTE — Telephone Encounter (Signed)
Rx request for hydcodone-acetaminophen 5-500.  Rx last filled 08/20/11 #120x2 rf.  Pt last seen 09/25/11.  Pls advise.

## 2011-10-21 NOTE — Telephone Encounter (Signed)
Pt was seen here in office today 

## 2011-10-28 ENCOUNTER — Telehealth: Payer: Self-pay | Admitting: Family Medicine

## 2011-10-28 NOTE — Telephone Encounter (Signed)
NO more Vicodin for the time being. If her back is bothering her that much, I need to refer her somewhere to work on this. She is too young to get dependent on narcotics

## 2011-10-28 NOTE — Telephone Encounter (Signed)
Refill request for Vicodin 5-500 mg take 1 po q6hrs prn. 

## 2011-10-29 ENCOUNTER — Encounter (INDEPENDENT_AMBULATORY_CARE_PROVIDER_SITE_OTHER): Payer: Self-pay | Admitting: General Surgery

## 2011-10-29 NOTE — Telephone Encounter (Signed)
Spoke with pt

## 2011-10-30 ENCOUNTER — Encounter (INDEPENDENT_AMBULATORY_CARE_PROVIDER_SITE_OTHER): Payer: Self-pay | Admitting: Surgery

## 2011-10-30 ENCOUNTER — Ambulatory Visit (INDEPENDENT_AMBULATORY_CARE_PROVIDER_SITE_OTHER): Payer: BC Managed Care – PPO | Admitting: Surgery

## 2011-10-30 ENCOUNTER — Other Ambulatory Visit (INDEPENDENT_AMBULATORY_CARE_PROVIDER_SITE_OTHER): Payer: Self-pay | Admitting: General Surgery

## 2011-10-30 VITALS — BP 136/94 | HR 100 | Temp 98.2°F | Resp 20 | Ht 65.0 in | Wt 246.6 lb

## 2011-10-30 DIAGNOSIS — N63 Unspecified lump in unspecified breast: Secondary | ICD-10-CM

## 2011-10-30 DIAGNOSIS — N6310 Unspecified lump in the right breast, unspecified quadrant: Secondary | ICD-10-CM

## 2011-10-30 NOTE — Progress Notes (Signed)
Chief Complaint:  Right breast discomfort with mammogram and ultrasound visualized node in right breast  History of Present Illness:  Marie Tran is an 41 y.o. female with a positive family history of breast cancer in her mother who has had discomfort and a palpalble area in her right breast.  This is at 11:30 about 2 fb from areolar margin between 2 stretch marks.  I was only able to retrieve a report which describes a lymph node in the area of this mass.  Since she has felt this and with her family history I am referring her back to radiology for ultrasound directed core biopsy.  I will see her back after this intervention.   Past Medical History  Diagnosis Date  . Migraines   . Hyperlipidemia   . Hypothyroidism   . Overactive bladder   . Obesity   . Breast lump     right breast, UOQ, intramammary lymph node, also left breast cyst     Past Surgical History  Procedure Date  . Tubal ligation   . Cholecystectomy 2009    Current Outpatient Prescriptions  Medication Sig Dispense Refill  . atorvastatin (LIPITOR) 40 MG tablet Take 1 tablet (40 mg total) by mouth daily.  30 tablet  11  . fluconazole (DIFLUCAN) 150 MG tablet TAKE AS NEEDED  1 tablet  1  . HYDROcodone-acetaminophen (VICODIN) 5-500 MG per tablet Take 5-500 tablets by mouth every 6 (six) hours as needed for pain.  120 tablet  2  . ibuprofen (ADVIL,MOTRIN) 800 MG tablet TAKE 1 TABLET BY MOUTH EVERY 6 HOURS AS NEEDED  120 tablet  6  . levothyroxine (SYNTHROID, LEVOTHROID) 150 MCG tablet Take 1 tablet (150 mcg total) by mouth daily.  30 tablet  11  . phentermine 30 MG capsule Take 1 capsule (30 mg total) by mouth daily.  30 capsule  5  . vitamin B-12 (CYANOCOBALAMIN) 100 MCG tablet Take 50 mcg by mouth daily.        . vitamin E 400 UNIT capsule Take 400 Units by mouth. 2 by mouth daily        Review of patient's allergies indicates no known allergies. Family History  Problem Relation Age of Onset  . Hypertension Other     . Cancer Other     ovarian,uterine,breast  . Stroke Other     female 1st degree relative <50  . Cancer Mother     breast  . Cancer Brother     brain   Social History:   reports that she has quit smoking. She has never used smokeless tobacco. She reports that she does not drink alcohol or use illicit drugs.   REVIEW OF SYSTEMS - PERTINENT POSITIVES ONLY: Strongly positive for cancer in her mother, daughter, and brother  Physical Exam:   Blood pressure 136/94, pulse 100, temperature 98.2 F (36.8 C), temperature source Temporal, resp. rate 20, height 5\' 5"  (1.651 m), weight 246 lb 9.6 oz (111.857 kg). Body mass index is 41.04 kg/(m^2).   Breast:  Bilateral exam shows no palpable lesions in breasts or axillae.  The area that she felt is as described above and is accentuated by the striae on the skin.   LABORATORY RESULTS: No results found for this or any previous visit (from the past 48 hour(s)).  RADIOLOGY RESULTS: No results found.  Problem List: Patient Active Problem List  Diagnoses  . HYPOTHYROIDISM  . HYPERLIPIDEMIA  . OVERWEIGHT  . OVERACTIVE BLADDER  . LUMP OR MASS  IN BREAST  . PERIMENOPAUSAL SYNDROME  . LOW BACK PAIN, CHRONIC  . CERVICAL STRAIN    Assessment & Plan: Refer to radiology for RBB of area.  See back after core biopsy    Matt B. Daphine Deutscher, MD, [facility] [phone] beeper [phone]  10/30/2011 2:23 PM

## 2011-10-30 NOTE — Progress Notes (Signed)
Addended by: Latricia Heft on: 10/30/2011 05:59 PM   Modules accepted: Orders

## 2011-10-30 NOTE — Assessment & Plan Note (Signed)
Right breast mass felt by patient.

## 2011-12-09 ENCOUNTER — Ambulatory Visit (INDEPENDENT_AMBULATORY_CARE_PROVIDER_SITE_OTHER): Payer: BC Managed Care – PPO | Admitting: Family Medicine

## 2011-12-09 ENCOUNTER — Encounter: Payer: Self-pay | Admitting: Family Medicine

## 2011-12-09 VITALS — BP 120/80 | HR 111 | Temp 99.4°F | Wt 245.0 lb

## 2011-12-09 DIAGNOSIS — J40 Bronchitis, not specified as acute or chronic: Secondary | ICD-10-CM

## 2011-12-09 MED ORDER — AZITHROMYCIN 250 MG PO TABS: ORAL_TABLET | ORAL | Status: AC

## 2011-12-09 MED ORDER — HYDROCODONE-HOMATROPINE 5-1.5 MG/5ML PO SYRP: 5.0000 mL | ORAL_SOLUTION | ORAL | Status: AC | PRN

## 2011-12-09 NOTE — Progress Notes (Signed)
  Subjective:    Patient ID: Marie Tran, female    DOB: 10-Nov-1970, 41 y.o.   MRN: [id number]  HPI Here for 4 days of fever, body aches, ST, chest congestion, and a dry cough.    Review of Systems  Constitutional: Positive for fever.  HENT: Positive for congestion, postnasal drip and sinus pressure.   Eyes: Negative.   Respiratory: Positive for cough.        Objective:   Physical Exam  Constitutional: She appears well-developed and well-nourished.  HENT:  Right Ear: External ear normal.  Left Ear: External ear normal.  Nose: Nose normal.  Mouth/Throat: Oropharynx is clear and moist. No oropharyngeal exudate.  Eyes: Conjunctivae are normal.  Neck: No thyromegaly present.  Pulmonary/Chest: Effort normal and breath sounds normal. No respiratory distress. She has no wheezes. She has no rales.  Lymphadenopathy:    She has no cervical adenopathy.          Assessment & Plan:  Off work tomorrow and Friday.

## 2011-12-15 ENCOUNTER — Telehealth: Payer: Self-pay | Admitting: Family Medicine

## 2011-12-15 NOTE — Telephone Encounter (Signed)
Refill request for Hydrocodon/Acetaminophen 5-500 mg take 1 po q6hrs prn.

## 2011-12-16 MED ORDER — HYDROCODONE-ACETAMINOPHEN 5-500 MG PO TABS: 5.0000 | ORAL_TABLET | Freq: Four times a day (QID) | ORAL | Status: DC | PRN

## 2011-12-16 NOTE — Telephone Encounter (Signed)
Call in #60 with 5 rf 

## 2011-12-16 NOTE — Telephone Encounter (Signed)
I called in script 

## 2011-12-28 ENCOUNTER — Encounter (INDEPENDENT_AMBULATORY_CARE_PROVIDER_SITE_OTHER): Payer: Self-pay

## 2012-02-16 ENCOUNTER — Other Ambulatory Visit (INDEPENDENT_AMBULATORY_CARE_PROVIDER_SITE_OTHER): Payer: Self-pay | Admitting: Surgery

## 2012-02-16 DIAGNOSIS — N63 Unspecified lump in unspecified breast: Secondary | ICD-10-CM

## 2012-03-02 ENCOUNTER — Telehealth: Payer: Self-pay | Admitting: Family Medicine

## 2012-03-02 NOTE — Telephone Encounter (Signed)
Refill request for Vicodin 5-500 mg take 1 po q6hrs prn and last here on 12/09/11.

## 2012-03-03 ENCOUNTER — Ambulatory Visit
Admission: RE | Admit: 2012-03-03 | Discharge: 2012-03-03 | Disposition: A | Discharge status: Home / Self Care | Payer: BC Managed Care – PPO | Source: Ambulatory Visit | Attending: Surgery | Admitting: Surgery

## 2012-03-03 DIAGNOSIS — N6310 Unspecified lump in the right breast, unspecified quadrant: Secondary | ICD-10-CM

## 2012-03-03 DIAGNOSIS — N63 Unspecified lump in unspecified breast: Secondary | ICD-10-CM

## 2012-03-03 NOTE — Telephone Encounter (Signed)
NO refills. We just gave her a 6 month supply on 12-16-11

## 2012-03-04 NOTE — Telephone Encounter (Signed)
I left voice message on pharmacy line.

## 2012-03-04 NOTE — Telephone Encounter (Signed)
In that case no refills until November

## 2012-03-04 NOTE — Telephone Encounter (Signed)
Pharmacy called back and said that pt has used up all of her refills.

## 2012-03-04 NOTE — Telephone Encounter (Signed)
I spoke with pharmacy and they will advise pt of this.

## 2012-03-23 ENCOUNTER — Ambulatory Visit (INDEPENDENT_AMBULATORY_CARE_PROVIDER_SITE_OTHER): Payer: BC Managed Care – PPO | Admitting: Family Medicine

## 2012-03-23 ENCOUNTER — Encounter: Payer: Self-pay | Admitting: Family Medicine

## 2012-03-23 VITALS — BP 124/80 | HR 87 | Temp 98.4°F | Wt 247.0 lb

## 2012-03-23 DIAGNOSIS — M542 Cervicalgia: Secondary | ICD-10-CM

## 2012-03-23 MED ORDER — HYDROCODONE-ACETAMINOPHEN 5-500 MG PO TABS: 5.0000 | ORAL_TABLET | Freq: Four times a day (QID) | ORAL | Status: DC | PRN

## 2012-03-23 NOTE — Progress Notes (Signed)
  Subjective:    Patient ID: Marie Tran, female    DOB: [birth year], 41 y.o.   MRN: [id number]  HPI Here for 6 months of pain in the neck which radiates down into both shoulders and both arms. She also has numbness that runs down both arms from the neck area, the right side being the worst. Some weakness of grip on the right side. Using Motrin and Vicodin.    Review of Systems  Respiratory: Negative.   Cardiovascular: Negative.   Musculoskeletal: Positive for arthralgias.  Neurological: Positive for weakness and numbness.       Objective:   Physical Exam  Constitutional: She is oriented to person, place, and time. She appears well-developed and well-nourished.  Musculoskeletal:       Very tender in the lower posterior neck around the C5 to C7 region. Reduced ROM of the neck.   Neurological: She is alert and oriented to person, place, and time. No cranial nerve deficit.          Assessment & Plan:  Probable pinched nerves in the cervical spine. We will set up an MRI for this soon.

## 2012-04-02 ENCOUNTER — Other Ambulatory Visit: Payer: BC Managed Care – PPO

## 2012-04-15 ENCOUNTER — Inpatient Hospital Stay: Admission: RE | Admit: 2012-04-15 | Payer: BC Managed Care – PPO | Source: Ambulatory Visit

## 2012-06-23 ENCOUNTER — Encounter: Payer: Self-pay | Admitting: Internal Medicine

## 2012-06-23 ENCOUNTER — Ambulatory Visit (INDEPENDENT_AMBULATORY_CARE_PROVIDER_SITE_OTHER): Payer: BC Managed Care – PPO | Admitting: Internal Medicine

## 2012-06-23 VITALS — BP 132/80 | Temp 98.5°F | Wt 242.0 lb

## 2012-06-23 DIAGNOSIS — R51 Headache: Secondary | ICD-10-CM

## 2012-06-23 DIAGNOSIS — R209 Unspecified disturbances of skin sensation: Secondary | ICD-10-CM

## 2012-06-23 DIAGNOSIS — S139XXA Sprain of joints and ligaments of unspecified parts of neck, initial encounter: Secondary | ICD-10-CM

## 2012-06-23 DIAGNOSIS — R208 Other disturbances of skin sensation: Secondary | ICD-10-CM

## 2012-06-23 MED ORDER — ALPRAZOLAM 0.5 MG PO TBDP: ORAL_TABLET | ORAL | Status: DC

## 2012-06-23 NOTE — Patient Instructions (Addendum)
You  may move around, but avoid painful motions and activities.  Apply heat to the sore area for 15 to 20 minutes 3 or 4 times daily for the next two to 3 days.  Orthopedic followup as discussed  Call or return to clinic prn if these symptoms worsen or fail to improve as anticipated.
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
## 2012-06-24 ENCOUNTER — Other Ambulatory Visit: Payer: Self-pay | Admitting: Family Medicine

## 2012-06-24 NOTE — Telephone Encounter (Signed)
Call in #60 with 5 rf 

## 2012-08-29 ENCOUNTER — Encounter: Payer: Self-pay | Admitting: Family Medicine

## 2012-08-29 ENCOUNTER — Ambulatory Visit (INDEPENDENT_AMBULATORY_CARE_PROVIDER_SITE_OTHER): Payer: BC Managed Care – PPO | Admitting: Family Medicine

## 2012-08-29 VITALS — BP 134/78 | HR 88 | Temp 98.3°F | Wt 249.0 lb

## 2012-08-29 DIAGNOSIS — N318 Other neuromuscular dysfunction of bladder: Secondary | ICD-10-CM

## 2012-08-29 DIAGNOSIS — N3281 Overactive bladder: Secondary | ICD-10-CM

## 2012-08-29 DIAGNOSIS — Z780 Asymptomatic menopausal state: Secondary | ICD-10-CM

## 2012-08-29 LAB — POCT URINALYSIS DIPSTICK
Ketones, UA: NEGATIVE
Protein, UA: NEGATIVE
Spec Grav, UA: 1.01
pH, UA: 6.5

## 2012-08-29 NOTE — Progress Notes (Signed)
  Subjective:    Patient ID: Deloria Lair, female    DOB: [birth year], 42 y.o.   MRN: [id number]  HPI Here to discuss recent hot flashes with sweats that come any time day or night. These started about 3 months ago, and she has not had a period since last October. No pain or cramps. Her last cpx was in Feb.2013.    Review of Systems  Constitutional: Negative.   Respiratory: Negative.   Cardiovascular: Negative.   Gastrointestinal: Negative.   Genitourinary: Positive for menstrual problem. Negative for vaginal bleeding, vaginal discharge, vaginal pain and pelvic pain.       Objective:   Physical Exam  Constitutional: She appears well-developed and well-nourished.  Cardiovascular: Normal rate, regular rhythm, normal heart sounds and intact distal pulses.   Pulmonary/Chest: Effort normal and breath sounds normal.  Lymphadenopathy:    She has no cervical adenopathy.          Assessment & Plan:  She seems to have started into menopause. I suggested some non-hormonal things to try like B complex vitamins, soy, and black cohosh. She will be due for another cpx with a Pap next month, and we will get labs then also including a TSH.

## 2012-09-15 ENCOUNTER — Other Ambulatory Visit: Payer: Self-pay | Admitting: Family Medicine

## 2012-09-20 ENCOUNTER — Other Ambulatory Visit (INDEPENDENT_AMBULATORY_CARE_PROVIDER_SITE_OTHER): Payer: BC Managed Care – PPO

## 2012-09-20 ENCOUNTER — Other Ambulatory Visit: Payer: Self-pay | Admitting: Family Medicine

## 2012-09-20 DIAGNOSIS — Z0000 Encounter for general adult medical examination without abnormal findings: Secondary | ICD-10-CM

## 2012-09-20 LAB — HEPATIC FUNCTION PANEL
Bilirubin, Direct: 0 mg/dL (ref 0.0–0.3)
Total Bilirubin: 0.3 mg/dL (ref 0.3–1.2)
Total Protein: 7.8 g/dL (ref 6.0–8.3)

## 2012-09-20 LAB — CBC WITH DIFFERENTIAL/PLATELET
Basophils Relative: 0.7 % (ref 0.0–3.0)
Eosinophils Absolute: 0.3 10*3/uL (ref 0.0–0.7)
MCHC: 32.6 g/dL (ref 30.0–36.0)
MCV: 83.2 fl (ref 78.0–100.0)
Monocytes Absolute: 0.5 10*3/uL (ref 0.1–1.0)
Neutrophils Relative %: 46.5 % (ref 43.0–77.0)
Platelets: 275 10*3/uL (ref 150.0–400.0)
RDW: 16.7 % — ABNORMAL HIGH (ref 11.5–14.6)

## 2012-09-20 LAB — LIPID PANEL
Cholesterol: 190 mg/dL (ref 0–200)
VLDL: 35.2 mg/dL (ref 0.0–40.0)

## 2012-09-20 LAB — BASIC METABOLIC PANEL
BUN: 13 mg/dL (ref 6–23)
CO2: 27 mEq/L (ref 19–32)
Chloride: 104 mEq/L (ref 96–112)
Creatinine, Ser: 0.8 mg/dL (ref 0.4–1.2)
Glucose, Bld: 77 mg/dL (ref 70–99)

## 2012-09-20 LAB — POCT URINALYSIS DIPSTICK
Ketones, UA: NEGATIVE
Leukocytes, UA: NEGATIVE
Protein, UA: NEGATIVE

## 2012-09-20 MED ORDER — HYDROCODONE-ACETAMINOPHEN 5-325 MG PO TABS: 1.0000 | ORAL_TABLET | ORAL | Status: DC | PRN

## 2012-09-20 NOTE — Telephone Encounter (Signed)
Change this to Vicodin 5/325. Call in #[phone] with 5 rf

## 2012-09-20 NOTE — Addendum Note (Signed)
Addended by: Aniceto Boss A on: 09/20/2012 03:23 PM   Modules accepted: Orders

## 2012-09-23 NOTE — Progress Notes (Signed)
Quick Note:  Pt has CPE next week ______ 

## 2012-09-27 ENCOUNTER — Other Ambulatory Visit (HOSPITAL_COMMUNITY)
Admission: RE | Admit: 2012-09-27 | Discharge: 2012-09-27 | Disposition: A | Discharge status: Home / Self Care | Payer: BC Managed Care – PPO | Source: Ambulatory Visit | Attending: Family Medicine | Admitting: Family Medicine

## 2012-09-27 ENCOUNTER — Ambulatory Visit (INDEPENDENT_AMBULATORY_CARE_PROVIDER_SITE_OTHER): Payer: BC Managed Care – PPO | Admitting: Family Medicine

## 2012-09-27 ENCOUNTER — Encounter: Payer: Self-pay | Admitting: Family Medicine

## 2012-09-27 VITALS — BP 124/80 | HR 114 | Temp 98.3°F | Ht 65.25 in | Wt 252.0 lb

## 2012-09-27 DIAGNOSIS — Z01419 Encounter for gynecological examination (general) (routine) without abnormal findings: Secondary | ICD-10-CM | POA: Insufficient documentation

## 2012-09-27 DIAGNOSIS — Z0000 Encounter for general adult medical examination without abnormal findings: Secondary | ICD-10-CM

## 2012-09-27 MED ORDER — LEVOTHYROXINE SODIUM 150 MCG PO TABS: 150.0000 ug | ORAL_TABLET | Freq: Every day | ORAL | Status: DC

## 2012-09-27 MED ORDER — PHENTERMINE HCL 30 MG PO CAPS: 30.0000 mg | ORAL_CAPSULE | Freq: Every day | ORAL | Status: DC

## 2012-09-27 MED ORDER — ATORVASTATIN CALCIUM 40 MG PO TABS: 40.0000 mg | ORAL_TABLET | Freq: Every day | ORAL | Status: DC

## 2012-09-27 NOTE — Addendum Note (Signed)
Addended by: Aniceto Boss A on: 09/27/2012 11:20 AM   Modules accepted: Orders

## 2012-09-27 NOTE — Progress Notes (Signed)
Subjective:    Patient ID: Marie Tran, female    DOB: 07-17-1971, 42 y.o.   MRN: [id number]  HPI 42 yr old female for a cpx. She feels well in general. We saw her a few weeks ago discussing probable menopausal symptoms. She has been having hot flashes, and these have responded somewhat to B complex vitamins and black cohosh. Her LMP was 5 months ago.    Review of Systems  Constitutional: Negative.  Negative for fever, diaphoresis, activity change, appetite change, fatigue and unexpected weight change.  HENT: Negative.  Negative for hearing loss, ear pain, nosebleeds, congestion, sore throat, trouble swallowing, neck pain, neck stiffness, voice change and tinnitus.   Eyes: Negative.  Negative for photophobia, pain, discharge, redness and visual disturbance.  Respiratory: Negative.  Negative for apnea, cough, choking, chest tightness, shortness of breath, wheezing and stridor.   Cardiovascular: Negative.  Negative for chest pain, palpitations and leg swelling.  Gastrointestinal: Negative.  Negative for nausea, vomiting, abdominal pain, diarrhea, constipation, blood in stool, abdominal distention and rectal pain.  Genitourinary: Negative.  Negative for dysuria, urgency, frequency, hematuria, flank pain, vaginal bleeding, vaginal discharge, enuresis, difficulty urinating, vaginal pain and menstrual problem.  Musculoskeletal: Negative.  Negative for myalgias, back pain, joint swelling, arthralgias and gait problem.  Skin: Negative.  Negative for color change, pallor, rash and wound.  Neurological: Negative.  Negative for dizziness, tremors, seizures, syncope, speech difficulty, weakness, light-headedness, numbness and headaches.  Hematological: Negative for adenopathy. Does not bruise/bleed easily.  Psychiatric/Behavioral: Negative.  Negative for hallucinations, behavioral problems, confusion, sleep disturbance, dysphoric mood and agitation. The patient is not nervous/anxious.         Objective:   Physical Exam  Constitutional: She appears well-developed and well-nourished. No distress.  HENT:  Head: Normocephalic and atraumatic.  Right Ear: External ear normal.  Left Ear: External ear normal.  Nose: Nose normal.  Mouth/Throat: Oropharynx is clear and moist. No oropharyngeal exudate.  Eyes: Conjunctivae and EOM are normal. Pupils are equal, round, and reactive to light. Right eye exhibits no discharge. Left eye exhibits no discharge. No scleral icterus.  Neck: Normal range of motion. Neck supple. No JVD present. No thyromegaly present.  Cardiovascular: Normal rate, regular rhythm, normal heart sounds and intact distal pulses.  Exam reveals no gallop and no friction rub.   No murmur heard. Pulmonary/Chest: Effort normal and breath sounds normal. No stridor. No respiratory distress. She has no wheezes. She has no rales. She exhibits no tenderness.  Abdominal: Soft. Normal appearance and bowel sounds are normal. She exhibits no distension, no abdominal bruit, no ascites and no mass. There is no hepatosplenomegaly. There is no tenderness. There is no rigidity, no rebound and no guarding. No hernia.  Genitourinary: Rectum normal, vagina normal and uterus normal. No breast swelling, tenderness, discharge or bleeding. Cervix exhibits no motion tenderness, no discharge and no friability. Right adnexum displays no mass, no tenderness and no fullness. Left adnexum displays no mass, no tenderness and no fullness. No erythema, tenderness or bleeding around the vagina. No vaginal discharge found.  Musculoskeletal: Normal range of motion. She exhibits no edema and no tenderness.  Lymphadenopathy:    She has no cervical adenopathy.  Neurological: She is alert. She has normal reflexes. No cranial nerve deficit. She exhibits normal muscle tone. Coordination normal.  Skin: Skin is warm and dry. No rash noted. She is not diaphoretic. No erythema. No pallor.  Psychiatric: She has a normal mood  and affect. Her behavior is  normal. Judgment and thought content normal.          Assessment & Plan:  Well exam. She gets another mammogram next month.

## 2012-10-04 ENCOUNTER — Encounter: Payer: Self-pay | Admitting: Family Medicine

## 2012-10-10 ENCOUNTER — Telehealth: Payer: Self-pay | Admitting: Family Medicine

## 2012-10-10 NOTE — Telephone Encounter (Signed)
Refill request for Fluconazole 150 mg, this came from [facility] in [place].

## 2012-10-10 NOTE — Telephone Encounter (Signed)
Call in #1 with 11 rf 

## 2012-10-11 MED ORDER — FLUCONAZOLE 150 MG PO TABS: 150.0000 mg | ORAL_TABLET | Freq: Once | ORAL | Status: DC

## 2012-10-11 NOTE — Telephone Encounter (Signed)
I sent script e-scribe. 

## 2012-10-15 HISTORY — PX: CERVICAL DISC SURGERY: SHX588

## 2012-11-02 ENCOUNTER — Telehealth: Payer: Self-pay | Admitting: Family Medicine

## 2012-11-02 NOTE — Telephone Encounter (Signed)
error 

## 2012-11-10 ENCOUNTER — Ambulatory Visit (INDEPENDENT_AMBULATORY_CARE_PROVIDER_SITE_OTHER): Payer: BC Managed Care – PPO | Admitting: Family Medicine

## 2012-11-10 ENCOUNTER — Encounter: Payer: Self-pay | Admitting: Family Medicine

## 2012-11-10 VITALS — BP 126/90 | HR 107 | Temp 99.4°F | Wt 250.0 lb

## 2012-11-10 DIAGNOSIS — Z5189 Encounter for other specified aftercare: Secondary | ICD-10-CM

## 2012-11-10 DIAGNOSIS — S139XXD Sprain of joints and ligaments of unspecified parts of neck, subsequent encounter: Secondary | ICD-10-CM
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
## 2012-12-05 ENCOUNTER — Other Ambulatory Visit: Payer: Self-pay | Admitting: Family Medicine

## 2012-12-05 NOTE — Telephone Encounter (Signed)
Tell her we discussed this at her last OV here. She is to get ALL pain medications from Dr. Yevette Edwards who is treating her workers comp neck injury. Nothing from me

## 2013-01-17 ENCOUNTER — Telehealth: Payer: Self-pay | Admitting: Family Medicine

## 2013-01-17 MED ORDER — FLUCONAZOLE 150 MG PO TABS: 150.0000 mg | ORAL_TABLET | ORAL | Status: DC | PRN

## 2013-01-17 NOTE — Telephone Encounter (Signed)
Per Dr. Clent Ridges, send in fluconazole with [facility] refills [facility] [place].

## 2013-01-17 NOTE — Telephone Encounter (Signed)
Pt is calling regarding yeast infection symptoms.  Pt had neck surgery on 01/10/13 and during surgery she received 2 IV antibiotics.  Pt reports that she took a Diflucan on 01/11/13 for her symptoms and symptoms improved until 01/16/13 when the symptoms returned (burning, itching and discharge).  Pt wants to know if it is ok that she repeats the dose of Diflucan.  (pt has refills available at the pharmacy).  OFFICE PLEASE FOLLOW UP WITH PT. RN triaged patient using Vaginal Discharge Protocol and came to a disposition of 'See Provider within 3 days'.  Pt did not want an appt, she just wanted question asked about using Diflucan.

## 2013-01-17 NOTE — Telephone Encounter (Signed)
Patient notified by telephone. 

## 2013-02-10 ENCOUNTER — Other Ambulatory Visit: Payer: Self-pay

## 2013-02-10 DIAGNOSIS — Z1231 Encounter for screening mammogram for malignant neoplasm of breast: Secondary | ICD-10-CM

## 2013-03-06 ENCOUNTER — Ambulatory Visit: Payer: BC Managed Care – PPO

## 2013-07-30 ENCOUNTER — Other Ambulatory Visit: Payer: Self-pay | Admitting: Family Medicine

## 2013-08-25 ENCOUNTER — Other Ambulatory Visit: Payer: Self-pay | Admitting: Family Medicine

## 2013-08-25 NOTE — Telephone Encounter (Signed)
Refill for 6 months. 

## 2013-09-05 HISTORY — PX: ROTATOR CUFF REPAIR W/ DISTAL CLAVICLE EXCISION: SHX2365

## 2013-09-07 ENCOUNTER — Other Ambulatory Visit: Payer: Self-pay | Admitting: Orthopedic Surgery

## 2013-09-08 ENCOUNTER — Encounter (HOSPITAL_BASED_OUTPATIENT_CLINIC_OR_DEPARTMENT_OTHER): Payer: Self-pay | Admitting: *Deleted
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
## 2013-09-11 ENCOUNTER — Ambulatory Visit (HOSPITAL_BASED_OUTPATIENT_CLINIC_OR_DEPARTMENT_OTHER)
Admission: RE | Admit: 2013-09-11 | Discharge: 2013-09-11 | Disposition: A | Discharge status: Home / Self Care | Payer: Worker's Compensation | Source: Ambulatory Visit | Attending: Orthopedic Surgery | Admitting: Orthopedic Surgery

## 2013-09-11 ENCOUNTER — Encounter (HOSPITAL_BASED_OUTPATIENT_CLINIC_OR_DEPARTMENT_OTHER)
Admission: RE | Disposition: A | Discharge status: Home / Self Care | Payer: Self-pay | Source: Ambulatory Visit | Attending: Orthopedic Surgery

## 2013-09-11 ENCOUNTER — Encounter (HOSPITAL_BASED_OUTPATIENT_CLINIC_OR_DEPARTMENT_OTHER): Payer: Self-pay

## 2013-09-11 ENCOUNTER — Ambulatory Visit (HOSPITAL_BASED_OUTPATIENT_CLINIC_OR_DEPARTMENT_OTHER): Payer: Worker's Compensation | Admitting: Anesthesiology

## 2013-09-11 ENCOUNTER — Encounter (HOSPITAL_BASED_OUTPATIENT_CLINIC_OR_DEPARTMENT_OTHER): Payer: Worker's Compensation | Admitting: Anesthesiology

## 2013-09-11 DIAGNOSIS — E039 Hypothyroidism, unspecified: Secondary | ICD-10-CM | POA: Insufficient documentation

## 2013-09-11 DIAGNOSIS — X58XXXS Exposure to other specified factors, sequela: Secondary | ICD-10-CM | POA: Insufficient documentation

## 2013-09-11 DIAGNOSIS — M25819 Other specified joint disorders, unspecified shoulder: Secondary | ICD-10-CM | POA: Diagnosis not present

## 2013-09-11 DIAGNOSIS — M25511 Pain in right shoulder: Secondary | ICD-10-CM

## 2013-09-11 DIAGNOSIS — M67919 Unspecified disorder of synovium and tendon, unspecified shoulder: Secondary | ICD-10-CM | POA: Insufficient documentation

## 2013-09-11 DIAGNOSIS — E785 Hyperlipidemia, unspecified: Secondary | ICD-10-CM | POA: Insufficient documentation

## 2013-09-11 DIAGNOSIS — M7580 Other shoulder lesions, unspecified shoulder: Secondary | ICD-10-CM

## 2013-09-11 DIAGNOSIS — IMO0001 Reserved for inherently not codable concepts without codable children: Secondary | ICD-10-CM | POA: Diagnosis not present

## 2013-09-11 DIAGNOSIS — Z6841 Body Mass Index (BMI) 40.0 and over, adult: Secondary | ICD-10-CM | POA: Diagnosis not present

## 2013-09-11 DIAGNOSIS — M719 Bursopathy, unspecified: Principal | ICD-10-CM | POA: Insufficient documentation

## 2013-09-11 DIAGNOSIS — Z87891 Personal history of nicotine dependence: Secondary | ICD-10-CM | POA: Diagnosis not present

## 2013-09-11 DIAGNOSIS — M25519 Pain in unspecified shoulder: Secondary | ICD-10-CM | POA: Diagnosis present

## 2013-09-11 HISTORY — DX: Other complications of anesthesia, initial encounter: T88.59XA

## 2013-09-11 HISTORY — DX: Other specified postprocedural states: Z98.890

## 2013-09-11 HISTORY — DX: Adverse effect of unspecified anesthetic, initial encounter: T41.45XA

## 2013-09-11 HISTORY — DX: Snoring: R06.83

## 2013-09-11 HISTORY — DX: Presence of dental prosthetic device (complete) (partial): Z97.2

## 2013-09-11 HISTORY — DX: Dysphonia: R49.0

## 2013-09-11 HISTORY — DX: Other specified postprocedural states: R11.2

## 2013-09-11 LAB — POCT HEMOGLOBIN-HEMACUE: HEMOGLOBIN: 12.1 g/dL (ref 12.0–15.0)

## 2013-09-11 SURGERY — SHOULDER ARTHROSCOPY WITH SUBACROMIAL DECOMPRESSION AND DISTAL CLAVICLE EXCISION
Anesthesia: Regional | Site: Shoulder | Laterality: Right

## 2013-09-11 MED ORDER — BUPIVACAINE HCL (PF) 0.5 % IJ SOLN
INTRAMUSCULAR | Status: AC
  Filled 2013-09-11: qty 30

## 2013-09-11 MED ORDER — OXYCODONE-ACETAMINOPHEN 5-325 MG PO TABS: 1.0000 | ORAL_TABLET | ORAL | Status: DC | PRN

## 2013-09-11 MED ORDER — ONDANSETRON HCL 4 MG/2ML IJ SOLN
INTRAMUSCULAR | Status: DC | PRN
  Administered 2013-09-11: 4 mg via INTRAVENOUS

## 2013-09-11 MED ORDER — BUPIVACAINE-EPINEPHRINE PF 0.5-1:200000 % IJ SOLN
INTRAMUSCULAR | Status: DC | PRN
  Administered 2013-09-11: 30 mL via PERINEURAL

## 2013-09-11 MED ORDER — SODIUM CHLORIDE 0.9 % IR SOLN
Status: DC | PRN
  Administered 2013-09-11: 4000 mL

## 2013-09-11 MED ORDER — ONDANSETRON HCL 4 MG/2ML IJ SOLN: 4.0000 mg | Freq: Four times a day (QID) | INTRAMUSCULAR | Status: DC | PRN

## 2013-09-11 MED ORDER — SCOPOLAMINE 1 MG/3DAYS TD PT72
MEDICATED_PATCH | TRANSDERMAL | Status: AC
  Filled 2013-09-11: qty 1

## 2013-09-11 MED ORDER — OXYCODONE HCL 5 MG PO TABS: 5.0000 mg | ORAL_TABLET | Freq: Once | ORAL | Status: DC | PRN

## 2013-09-11 MED ORDER — FENTANYL CITRATE 0.05 MG/ML IJ SOLN
50.0000 ug | INTRAMUSCULAR | Status: DC | PRN
  Administered 2013-09-11: 50 ug via INTRAVENOUS

## 2013-09-11 MED ORDER — SUCCINYLCHOLINE CHLORIDE 20 MG/ML IJ SOLN
INTRAMUSCULAR | Status: DC | PRN
  Administered 2013-09-11: 100 mg via INTRAVENOUS

## 2013-09-11 MED ORDER — HYDROMORPHONE HCL PF 1 MG/ML IJ SOLN: 0.2500 mg | INTRAMUSCULAR | Status: DC | PRN

## 2013-09-11 MED ORDER — LACTATED RINGERS IV SOLN
INTRAVENOUS | Status: DC
  Administered 2013-09-11: 12:00:00 via INTRAVENOUS

## 2013-09-11 MED ORDER — PROPOFOL 10 MG/ML IV BOLUS
INTRAVENOUS | Status: DC | PRN
  Administered 2013-09-11: 200 mg via INTRAVENOUS

## 2013-09-11 MED ORDER — MIDAZOLAM HCL 2 MG/2ML IJ SOLN
1.0000 mg | INTRAMUSCULAR | Status: DC | PRN
  Administered 2013-09-11: 1 mg via INTRAVENOUS

## 2013-09-11 MED ORDER — DEXAMETHASONE SODIUM PHOSPHATE 4 MG/ML IJ SOLN
INTRAMUSCULAR | Status: DC | PRN
  Administered 2013-09-11: 10 mg via INTRAVENOUS

## 2013-09-11 MED ORDER — CEFAZOLIN SODIUM-DEXTROSE 2-3 GM-% IV SOLR
2.0000 g | INTRAVENOUS | Status: AC
  Administered 2013-09-11: 2 g via INTRAVENOUS

## 2013-09-11 MED ORDER — EPHEDRINE SULFATE 50 MG/ML IJ SOLN
INTRAMUSCULAR | Status: DC | PRN
  Administered 2013-09-11: 15 mg via INTRAVENOUS

## 2013-09-11 MED ORDER — MIDAZOLAM HCL 2 MG/2ML IJ SOLN
INTRAMUSCULAR | Status: AC
  Filled 2013-09-11: qty 2

## 2013-09-11 MED ORDER — DOCUSATE SODIUM 100 MG PO CAPS: 100.0000 mg | ORAL_CAPSULE | Freq: Three times a day (TID) | ORAL | Status: DC | PRN

## 2013-09-11 MED ORDER — SCOPOLAMINE 1 MG/3DAYS TD PT72
1.0000 | MEDICATED_PATCH | TRANSDERMAL | Status: DC
  Administered 2013-09-11: 1.5 mg via TRANSDERMAL

## 2013-09-11 MED ORDER — OXYCODONE HCL 5 MG/5ML PO SOLN: 5.0000 mg | Freq: Once | ORAL | Status: DC | PRN

## 2013-09-11 MED ORDER — POVIDONE-IODINE 7.5 % EX SOLN: Freq: Once | CUTANEOUS | Status: DC

## 2013-09-11 MED ORDER — FENTANYL CITRATE 0.05 MG/ML IJ SOLN
INTRAMUSCULAR | Status: AC
  Filled 2013-09-11: qty 2

## 2013-09-11 MED ORDER — LIDOCAINE HCL (CARDIAC) 20 MG/ML IV SOLN
INTRAVENOUS | Status: DC | PRN
Start: 2013-09-11 — End: 2013-09-11
  Administered 2013-09-11: 80 mg via INTRAVENOUS

## 2013-09-11 MED ORDER — CEFAZOLIN SODIUM-DEXTROSE 2-3 GM-% IV SOLR
INTRAVENOUS | Status: AC
Start: 2013-09-11 — End: 2013-09-11
  Filled 2013-09-11: qty 50

## 2013-09-11 SURGICAL SUPPLY — 79 items
ADH SKN CLS APL DERMABOND .7 (GAUZE/BANDAGES/DRESSINGS)
APL SKNCLS STERI-STRIP NONHPOA (GAUZE/BANDAGES/DRESSINGS)
BENZOIN TINCTURE PRP APPL 2/3 (GAUZE/BANDAGES/DRESSINGS) IMPLANT
BLADE SURG 15 STRL LF DISP TIS (BLADE) IMPLANT
BLADE SURG 15 STRL SS (BLADE)
BLADE SURG ROTATE 9660 (MISCELLANEOUS) IMPLANT
BUR OVAL 4.0 (BURR) ×3 IMPLANT
CANISTER SUCT 3000ML (MISCELLANEOUS) IMPLANT
CANISTER SUCT LVC 12 LTR MEDI- (MISCELLANEOUS) ×3 IMPLANT
CANNULA 5.75X71 LONG (CANNULA) ×3 IMPLANT
CANNULA TWIST IN 8.25X7CM (CANNULA) IMPLANT
CHLORAPREP W/TINT 26ML (MISCELLANEOUS) ×3 IMPLANT
CLOSURE WOUND 1/2 X4 (GAUZE/BANDAGES/DRESSINGS)
DECANTER SPIKE VIAL GLASS SM (MISCELLANEOUS) IMPLANT
DERMABOND ADVANCED (GAUZE/BANDAGES/DRESSINGS)
DERMABOND ADVANCED .7 DNX12 (GAUZE/BANDAGES/DRESSINGS) IMPLANT
DRAPE INCISE IOBAN 66X45 STRL (DRAPES) ×3 IMPLANT
DRAPE STERI 35X30 U-POUCH (DRAPES) ×3 IMPLANT
DRAPE SURG 17X23 STRL (DRAPES) ×3 IMPLANT
DRAPE U 20/CS (DRAPES) ×3 IMPLANT
DRAPE U-SHAPE 47X51 STRL (DRAPES) ×3 IMPLANT
DRAPE U-SHAPE 76X120 STRL (DRAPES) ×6 IMPLANT
ELECT REM PT RETURN 9FT ADLT (ELECTROSURGICAL) ×3
ELECTRODE REM PT RTRN 9FT ADLT (ELECTROSURGICAL) ×1 IMPLANT
GAUZE SPONGE 4X4 16PLY XRAY LF (GAUZE/BANDAGES/DRESSINGS) IMPLANT
GAUZE XEROFORM 1X8 LF (GAUZE/BANDAGES/DRESSINGS) ×6 IMPLANT
GLOVE BIO SURGEON STRL SZ7 (GLOVE) ×3 IMPLANT
GLOVE BIO SURGEON STRL SZ7.5 (GLOVE) ×6 IMPLANT
GLOVE BIOGEL PI IND STRL 7.0 (GLOVE) ×1 IMPLANT
GLOVE BIOGEL PI IND STRL 8 (GLOVE) ×2 IMPLANT
GLOVE BIOGEL PI INDICATOR 7.0 (GLOVE) ×2
GLOVE BIOGEL PI INDICATOR 8 (GLOVE) ×4
GOWN STRL REUS W/ TWL LRG LVL3 (GOWN DISPOSABLE) ×2 IMPLANT
GOWN STRL REUS W/TWL LRG LVL3 (GOWN DISPOSABLE) ×6
GOWN STRL REUS W/TWL XL LVL4 (GOWN DISPOSABLE) ×3 IMPLANT
NDL SUT 6 .5 CRC .975X.05 MAYO (NEEDLE) IMPLANT
NEEDLE 1/2 CIR CATGUT .05X1.09 (NEEDLE) IMPLANT
NEEDLE MAYO TAPER (NEEDLE)
NEEDLE SCORPION MULTI FIRE (NEEDLE) IMPLANT
NS IRRIG 1000ML POUR BTL (IV SOLUTION) IMPLANT
PACK ARTHROSCOPY DSU (CUSTOM PROCEDURE TRAY) ×3 IMPLANT
PACK BASIN DAY SURGERY FS (CUSTOM PROCEDURE TRAY) ×3 IMPLANT
PAD ABD 8X10 STRL (GAUZE/BANDAGES/DRESSINGS) ×3 IMPLANT
PENCIL BUTTON HOLSTER BLD 10FT (ELECTRODE) IMPLANT
RESECTOR FULL RADIUS 4.2MM (BLADE) ×3 IMPLANT
SHEET MEDIUM DRAPE 40X70 STRL (DRAPES) IMPLANT
SLEEVE SCD COMPRESS KNEE MED (MISCELLANEOUS) ×3 IMPLANT
SLING ARM IMMOBILIZER MED (SOFTGOODS) IMPLANT
SLING ARM LRG ADULT FOAM STRAP (SOFTGOODS) ×3 IMPLANT
SLING ARM MED ADULT FOAM STRAP (SOFTGOODS) IMPLANT
SLING ARM XL FOAM STRAP (SOFTGOODS) IMPLANT
SPONGE GAUZE 4X4 12PLY (GAUZE/BANDAGES/DRESSINGS) ×3 IMPLANT
SPONGE LAP 4X18 X RAY DECT (DISPOSABLE) IMPLANT
STRIP CLOSURE SKIN 1/2X4 (GAUZE/BANDAGES/DRESSINGS) IMPLANT
SUCTION FRAZIER TIP 10 FR DISP (SUCTIONS) IMPLANT
SUPPORT WRAP ARM LG (MISCELLANEOUS) IMPLANT
SUT BONE WAX W31G (SUTURE) IMPLANT
SUT ETHIBOND 2 OS 4 DA (SUTURE) IMPLANT
SUT ETHILON 3 0 PS 1 (SUTURE) ×3 IMPLANT
SUT ETHILON 4 0 PS 2 18 (SUTURE) IMPLANT
SUT FIBERWIRE #2 38 T-5 BLUE (SUTURE)
SUT MNCRL AB 3-0 PS2 18 (SUTURE) IMPLANT
SUT MNCRL AB 4-0 PS2 18 (SUTURE) IMPLANT
SUT PDS AB 0 CT 36 (SUTURE) IMPLANT
SUT PROLENE 3 0 PS 2 (SUTURE) IMPLANT
SUT VIC AB 0 CT1 27 (SUTURE)
SUT VIC AB 0 CT1 27XBRD ANBCTR (SUTURE) IMPLANT
SUT VIC AB 2-0 SH 27 (SUTURE)
SUT VIC AB 2-0 SH 27XBRD (SUTURE) IMPLANT
SUTURE FIBERWR #2 38 T-5 BLUE (SUTURE) IMPLANT
SYR BULB 3OZ (MISCELLANEOUS) IMPLANT
TOWEL OR 17X24 6PK STRL BLUE (TOWEL DISPOSABLE) ×3 IMPLANT
TOWEL OR NON WOVEN STRL DISP B (DISPOSABLE) ×3 IMPLANT
TUBE CONNECTING 20'X1/4 (TUBING) ×1
TUBE CONNECTING 20X1/4 (TUBING) ×2 IMPLANT
TUBING ARTHROSCOPY IRRIG 16FT (MISCELLANEOUS) ×3 IMPLANT
WAND STAR VAC 90 (SURGICAL WAND) ×3 IMPLANT
WATER STERILE IRR 1000ML POUR (IV SOLUTION) ×3 IMPLANT
YANKAUER SUCT BULB TIP NO VENT (SUCTIONS) IMPLANT

## 2013-09-11 NOTE — Anesthesia Postprocedure Evaluation (Signed)
Anesthesia Post Note  Patient: Marie Tran  Procedure(s) Performed: Procedure(s) (LRB): SHOULDER ARTHROSCOPY WITH SUBACROMIAL DECOMPRESSION AND DISTAL CLAVICLE EXCISION (Right)  Anesthesia type: General  Patient location: PACU  Post pain: Pain level controlled and Adequate analgesia  Post assessment: Post-op Vital signs reviewed, Patient's Cardiovascular Status Stable, Respiratory Function Stable, Patent Airway and Pain level controlled  Last Vitals:  Filed Vitals:   09/11/13 1515  BP: 163/87  Pulse: 107  Temp:   Resp: 18    Post vital signs: Reviewed and stable  Level of consciousness: awake, alert  and oriented  Complications: No apparent anesthesia complications

## 2013-09-11 NOTE — Anesthesia Procedure Notes (Addendum)
Anesthesia Regional Block:  Interscalene brachial plexus block  Pre-Anesthetic Checklist: ,, timeout performed, Correct Patient, Correct Site, Correct Laterality, Correct Procedure, Correct Position, site marked, Risks and benefits discussed,  Surgical consent,  Pre-op evaluation,  At surgeon's request and post-op pain management  Laterality: Right  Prep: chloraprep       Needles:  Injection technique: Single-shot  Needle Type: Echogenic Stimulator Needle     Needle Length: 5cm 5 cm Needle Gauge: 22 and 22 G    Additional Needles:  Procedures: ultrasound guided (picture in chart) and nerve stimulator Interscalene brachial plexus block  Nerve Stimulator or Paresthesia:  Response: biceps flexion, 0.45 mA,   Additional Responses:   Narrative:  Start time: 09/11/2013 1:06 PM End time: 09/11/2013 1:24 PM Injection made incrementally with aspirations every 5 mL.  Performed by: Personally  Anesthesiologist: Dr Marcie Bal  Additional Notes: Functioning IV was confirmed and monitors were applied.  A 78mm 22ga Arrow echogenic stimulator needle was used. Sterile prep and drape,hand hygiene and sterile gloves were used.  Negative aspiration and negative test dose prior to incremental administration of local anesthetic. The patient tolerated the procedure well.  Ultrasound guidance: relevent anatomy identified, needle position confirmed, local anesthetic spread visualized around nerve(s), vascular puncture avoided.  Image printed for medical record.    Procedure Name: Intubation Performed by: Terrance Mass Pre-anesthesia Checklist: Patient identified, Timeout performed, Emergency Drugs available, Suction available and Patient being monitored Patient Re-evaluated:Patient Re-evaluated prior to inductionOxygen Delivery Method: Circle system utilized Preoxygenation: Pre-oxygenation with 100% oxygen Intubation Type: IV induction Ventilation: Mask ventilation without  difficulty Laryngoscope Size: Miller and 2 Grade View: Grade I Tube type: Oral Tube size: 7.0 mm Number of attempts: 1 Airway Equipment and Method: Stylet Placement Confirmation: ETT inserted through vocal cords under direct vision,  breath sounds checked- equal and bilateral and positive ETCO2 Secured at: 23 cm Tube secured with: Tape Dental Injury: Teeth and Oropharynx as per pre-operative assessment

## 2013-09-11 NOTE — Progress Notes (Signed)
Assisted Dr. Hodierne with right, ultrasound guided, interscalene  block. Side rails up, monitors on throughout procedure. See vital signs in flow sheet. Tolerated Procedure well. 

## 2013-09-11 NOTE — Transfer of Care (Signed)
Immediate Anesthesia Transfer of Care Note  Patient: Marie Tran  Procedure(s) Performed: Procedure(s) with comments: SHOULDER ARTHROSCOPY WITH SUBACROMIAL DECOMPRESSION AND DISTAL CLAVICLE EXCISION (Right) - Right shoulder arthroscopy debridement of rotator cuff, subacromial decompression, distal clavical excision  Patient Location: PACU  Anesthesia Type:General  Level of Consciousness: awake and sedated  Airway & Oxygen Therapy: Patient Spontanous Breathing and Patient connected to face mask oxygen  Post-op Assessment: Report given to PACU RN and Post -op Vital signs reviewed and stable  Post vital signs: Reviewed and stable  Complications: No apparent anesthesia complications

## 2013-09-11 NOTE — Anesthesia Preprocedure Evaluation (Signed)
Anesthesia Evaluation  Patient identified by MRN, date of birth, ID band Patient awake    Reviewed: Allergy & Precautions, H&P , NPO status , Patient's Chart, lab work & pertinent test results  History of Anesthesia Complications (+) PONV  Airway Mallampati: II  Neck ROM: full    Dental   Pulmonary former smoker,          Cardiovascular negative cardio ROS      Neuro/Psych  Headaches,  Neuromuscular disease    GI/Hepatic   Endo/Other  Hypothyroidism Morbid obesity  Renal/GU      Musculoskeletal   Abdominal   Peds  Hematology   Anesthesia Other Findings   Reproductive/Obstetrics                           Anesthesia Physical Anesthesia Plan  ASA: II  Anesthesia Plan: General and Regional   Post-op Pain Management: MAC Combined w/ Regional for Post-op pain   Induction: Intravenous  Airway Management Planned: Oral ETT  Additional Equipment:   Intra-op Plan:   Post-operative Plan: Extubation in OR  Informed Consent: I have reviewed the patients History and Physical, chart, labs and discussed the procedure including the risks, benefits and alternatives for the proposed anesthesia with the patient or authorized representative who has indicated his/her understanding and acceptance.     Plan Discussed with: CRNA, Anesthesiologist and Surgeon  Anesthesia Plan Comments:         Anesthesia Quick Evaluation

## 2013-09-11 NOTE — H&P (Signed)
Marie Tran is an 43 y.o. female.   Chief Complaint: R shoulder pain HPI: R shoulder pain after injury at work, failed nonop treatment.  Past Medical History  Diagnosis Date  . Migraines   . Hyperlipidemia   . Hypothyroidism   . Overactive bladder   . Obesity   . Breast lump     right breast, UOQ, intramammary lymph node, also left breast cyst   . Neck pain, chronic     sees Dr. Phylliss Bob  . Hoarseness   . Wears dentures     top  . Snores   . PONV (postoperative nausea and vomiting)     had to have scope patch last surgery  . Complication of anesthesia     hard to wake up after lap choli    Past Surgical History  Procedure Laterality Date  . Tubal ligation    . Cholecystectomy  2009  . Cervical disc surgery  3/14    also fusion    Family History  Problem Relation Age of Onset  . Hypertension Other   . Cancer Other     ovarian,uterine,breast  . Stroke Other     female 1st degree relative <50  . Cancer Mother     breast  . Cancer Brother     brain   Social History:  reports that she quit smoking about 25 years ago. She has never used smokeless tobacco. She reports that she does not drink alcohol or use illicit drugs.  Allergies: No Known Allergies  Medications Prior to Admission  Medication Sig Dispense Refill  . atorvastatin (LIPITOR) 40 MG tablet TAKE 1 TABLET BY MOUTH ONCE A DAY  90 tablet  2  . ibuprofen (ADVIL,MOTRIN) 800 MG tablet TAKE 1 TABLET BY MOUTH EVERY 6 HOURS AS NEEDED  120 tablet  6  . levothyroxine (SYNTHROID, LEVOTHROID) 150 MCG tablet TAKE 1 TABLET BY MOUTH ONCE A DAY  90 tablet  2  . Multiple Vitamins-Minerals (MULTIVITAMIN WITH MINERALS) tablet Take 1 tablet by mouth daily.      Marland Kitchen omeprazole (PRILOSEC) 40 MG capsule Take 40 mg by mouth every evening.      . traMADol (ULTRAM) 50 MG tablet Take by mouth every 6 (six) hours as needed.        Results for orders placed during the hospital encounter of 09/11/13 (from the past 48 hour(s))   POCT HEMOGLOBIN-HEMACUE     Status: None   Collection Time    09/11/13 12:02 PM      Result Value Range   Hemoglobin 12.1  12.0 - 15.0 g/dL   No results found.  Review of Systems  All other systems reviewed and are negative.    Blood pressure 149/89, pulse 86, temperature 98.4 F (36.9 C), temperature source Oral, resp. rate 20, height 5' 5.5" (1.664 m), weight 118.57 kg (261 lb 6.4 oz), last menstrual period 06/08/2013, SpO2 99.00%. Physical Exam  Constitutional: She is oriented to person, place, and time. She appears well-developed and well-nourished.  HENT:  Head: Atraumatic.  Eyes: EOM are normal.  Cardiovascular: Intact distal pulses.   Respiratory: Effort normal.  Musculoskeletal:  R shoulder TTP over AC joint. Pain with Jobes test.  Neurological: She is alert and oriented to person, place, and time.  Skin: Skin is warm and dry.  Psychiatric: She has a normal mood and affect.     Assessment/Plan Plan R shoulder arthroscopic SAD/DCR, debride RCT Risks / benefits of surgery discussed Consent on chart  NPO for OR Preop antibiotics   Marie Tran WILLIAM 09/11/2013, 1:09 PM

## 2013-09-11 NOTE — Discharge Instructions (Signed)
Discharge Instructions after Arthroscopic Shoulder Surgery ° ° °A sling has been provided for you. You may remove the sling after 72 hours. The sling may be worn for your protection, if you are in a crowd.  °Use ice on the shoulder intermittently over the first 48 hours after surgery.  °Pain medication has been prescribed for you.  °Use your medication liberally over the first 48 hours, and then begin to taper your use. You may take Extra Strength Tylenol or Tylenol only in place of the pain pills. DO NOT take ANY nonsteroidal anti-inflammatory pain medications: Advil, Motrin, Ibuprofen, Aleve, Naproxen, or Naprosyn.  °You may remove your dressing after two days.  °You may shower 5 days after surgery. The incision CANNOT get wet prior to 5 days. Simply allow the water to wash over the site and then pat dry. Do not rub the incision. Make sure your axilla (armpit) is completely dry after showering.  °Take one aspirin a day for 2 weeks after surgery, unless you have an aspirin sensitivity/allergy or asthma.  °Three to 5 times each day you should perform assisted overhead reaching and external rotation (outward turning) exercises with the operative arm. Both exercises should be done with the non-operative arm used as the "therapist arm" while the operative arm remains relaxed. Ten of each exercise should be done three to five times each day. ° ° ° °Overhead reach is helping to lift your stiff arm up as high as it will go. To stretch your overhead reach, lie flat on your back, relax, and grasp the wrist of the tight shoulder with your opposite hand. Using the power in your opposite arm, bring the stiff arm up as far as it is comfortable. Start holding it for ten seconds and then work up to where you can hold it for a count of 30. Breathe slowly and deeply while the arm is moved. Repeat this stretch ten times, trying to help the arm up a little higher each time.  ° ° ° ° ° °External rotation is turning the arm out to  the side while your elbow stays close to your body. External rotation is best stretched while you are lying on your back. Hold a cane, yardstick, broom handle, or dowel in both hands. Bend both elbows to a right angle. Use steady, gentle force from your normal arm to rotate the hand of the stiff shoulder out away from your body. Continue the rotation as far as it will go comfortably, holding it there for a count of 10. Repeat this exercise ten times.  ° ° ° °Please call [phone] during normal business hours or [phone] after hours for any problems. Including the following: ° °- excessive redness of the incisions °- drainage for more than 4 days °- fever of more than 101.5 F ° °*Please note that pain medications will not be refilled after hours or on weekends. ° ° ° °Post Anesthesia Home Care Instructions ° °Activity: °Get plenty of rest for the remainder of the day. A responsible adult should stay with you for 24 hours following the procedure.  °For the next 24 hours, DO NOT: °-Drive a car °-Operate machinery °-Drink alcoholic beverages °-Take any medication unless instructed by your physician °-Make any legal decisions or sign important papers. ° °Meals: °Start with liquid foods such as gelatin or soup. Progress to regular foods as tolerated. Avoid greasy, spicy, heavy foods. If nausea and/or vomiting occur, drink only clear liquids until the nausea and/or vomiting subsides. Call   your physician if vomiting continues. ° °Special Instructions/Symptoms: °Your throat may feel dry or sore from the anesthesia or the breathing tube placed in your throat during surgery. If this causes discomfort, gargle with warm salt water. The discomfort should disappear within 24 hours. ° ° °Regional Anesthesia Blocks ° °1. Numbness or the inability to move the "blocked" extremity may last from 3-48 hours after placement. The length of time depends on the medication injected and your individual response to the medication. If the  numbness is not going away after 48 hours, call your surgeon. ° °2. The extremity that is blocked will need to be protected until the numbness is gone and the  Strength has returned. Because you cannot feel it, you will need to take extra care to avoid injury. Because it may be weak, you may have difficulty moving it or using it. You may not know what position it is in without looking at it while the block is in effect. ° °3. For blocks in the legs and feet, returning to weight bearing and walking needs to be done carefully. You will need to wait until the numbness is entirely gone and the strength has returned. You should be able to move your leg and foot normally before you try and bear weight or walk. You will need someone to be with you when you first try to ensure you do not fall and possibly risk injury. ° °4. Bruising and tenderness at the needle site are common side effects and will resolve in a few days. ° °5. Persistent numbness or new problems with movement should be communicated to the surgeon or the [facility] ([phone])/ [facility] ([phone]). °

## 2013-09-11 NOTE — Op Note (Signed)
Procedure(s): SHOULDER ARTHROSCOPY WITH SUBACROMIAL DECOMPRESSION AND DISTAL CLAVICLE EXCISION Procedure Note  Marie Tran female 43 y.o. 09/11/2013  Procedure(s) and Anesthesia Type:    #1 arthroscopic debridement right shoulder partial thickness supraspinatus tear and rotator interval lesion    #2 right shoulder arthroscopic subacromial decompression   #3 right shoulder arthroscopic distal clavicle excision   Surgeon(s) and Role:    * Nita Sells, MD - Primary     Surgeon: Nita Sells   Assistants: Jeanmarie Hubert PA-C (Danielle was present and scrubbed throughout the procedure and was essential in positioning, assisting with the camera and instrumentation,, and closure)  Anesthesia: General endotracheal anesthesia with preoperative interscalene block    Procedure Detail  SHOULDER ARTHROSCOPY WITH SUBACROMIAL DECOMPRESSION AND DISTAL CLAVICLE EXCISION  Estimated Blood Loss: Min         Drains: none  Blood Given: none         Specimens: none        Complications:  * No complications entered in OR log *         Disposition: PACU - hemodynamically stable.         Condition: stable    Procedure:   INDICATIONS FOR SURGERY: The patient is 43 y.o. female who has had a long history of right shoulder pain since an injury at work. The injury was over one year ago. She has had conservative management with injection therapy, physical therapy, anti-inflammatory medicines. She continues to have pain. She was to go forward with surgery to try and decrease pain and increase function.  OPERATIVE FINDINGS: Examination under anesthesia: No stiffness or instability Diagnostic Arthroscopy:  Glenoid articular cartilage: Intact Humeral head articular cartilage: Intact Labrum: Intact Loose bodies: None Synovitis: Minimal Articular sided rotator cuff: Very small partial thickness articular sided tear just posterior to the biceps tendon,  debrided Bursal sided rotator cuff: Intact Coracoacromial ligament: Frayed indicating chronic impingement, moderate anterior acromial spur and dressed with a standard acromioplasty A.c. joint arthritic, addressed with distal clavicle excision  DESCRIPTION OF PROCEDURE: The patient was identified in preoperative  holding area where I personally marked the operative site after  verifying site, side, and procedure with the patient. An interscalene block was given by the attending anesthesiologist the holding area.  The patient was taken back to the operating room where general anesthesia was induced without complication and was placed in the beach-chair position with the back  elevated about 60 degrees and all extremities and head and neck carefully padded and  positioned.   The right upper extremity was then prepped and  draped in a standard sterile fashion. The appropriate time-out  procedure was carried out. The patient did receive IV antibiotics  within 30 minutes of incision.   A small posterior portal incision was made and the arthroscope was introduced into the joint. An anterior portal was then established above the subscapularis using needle localization. Small cannula was placed anteriorly. Diagnostic arthroscopy was then carried out with findings as described above.  The glenohumeral joint was largely without any pathologic findings. The main issues seen were a partial-thickness rotator cuff tear which was very small just posterior to the biceps tendon. This was debrided back to healthy tendon. There is no full-thickness component. She also had a small flap tear within the rotator interval which was debrided. There is no disruption of the biceps anchor or the biceps sling. No loose bodies were noted. The glenohumeral cartilage was intact.  The arthroscope was then introduced into  the subacromial space a standard lateral portal was established with needle localization. The shaver was  used through the lateral portal to perform extensive bursectomy. Coracoacromial ligament was examined and found to be frayed indicating chronic impingement.  The bursal surface of the rotator cuff was carefully examined and found to be completely intact.  The coracoacromial ligament was taken down off the anterior acromion with the ArthroCare exposing a moderate hooked anterior acromial spur. A high-speed bur was then used through the lateral portal to take down the anterior acromial spur from lateral to medial in a standard acromioplasty.  The acromioplasty was also viewed from the lateral portal and the bur was used as necessary to ensure that the acromion was completely flat from posterior to anterior.  The distal clavicle was exposed arthroscopically and the bur was used to take off the undersurface for approximately 8 mm from the lateral portal. The bur was then moved to an anterior portal position to complete the distal clavicle excision resecting about 8 mm of the distal clavicle and a smooth even fashion. This was viewed from anterior and lateral portals and felt to be complete.  The arthroscopic equipment was removed from the joint and the portals were closed with 3-0 nylon in an interrupted fashion. Sterile dressings were then applied including Xeroform 4 x 4's ABDs and tape. The patient was then allowed to awaken from general anesthesia, placed in a sling, transferred to the stretcher and taken to the recovery room in stable condition.   POSTOPERATIVE PLAN: The patient will be discharged home today and will followup in one week for suture removal and wound check.

## 2013-11-02 ENCOUNTER — Other Ambulatory Visit (INDEPENDENT_AMBULATORY_CARE_PROVIDER_SITE_OTHER): Payer: BC Managed Care – PPO

## 2013-11-02 DIAGNOSIS — Z0000 Encounter for general adult medical examination without abnormal findings: Secondary | ICD-10-CM

## 2013-11-02 LAB — TSH: TSH: 0.82 u[IU]/mL (ref 0.35–5.50)

## 2013-11-02 LAB — CBC WITH DIFFERENTIAL/PLATELET
Basophils Absolute: 0 10*3/uL (ref 0.0–0.1)
Basophils Relative: 0.6 % (ref 0.0–3.0)
EOS ABS: 0.2 10*3/uL (ref 0.0–0.7)
EOS PCT: 3.4 % (ref 0.0–5.0)
HCT: 35.5 % — ABNORMAL LOW (ref 36.0–46.0)
Hemoglobin: 11.3 g/dL — ABNORMAL LOW (ref 12.0–15.0)
Lymphocytes Relative: 38 % (ref 12.0–46.0)
Lymphs Abs: 2.3 10*3/uL (ref 0.7–4.0)
MCHC: 31.6 g/dL (ref 30.0–36.0)
MCV: 80.8 fl (ref 78.0–100.0)
Monocytes Absolute: 0.5 10*3/uL (ref 0.1–1.0)
Monocytes Relative: 8.8 % (ref 3.0–12.0)
NEUTROS PCT: 49.2 % (ref 43.0–77.0)
Neutro Abs: 3 10*3/uL (ref 1.4–7.7)
PLATELETS: 277 10*3/uL (ref 150.0–400.0)
RBC: 4.4 Mil/uL (ref 3.87–5.11)
RDW: 17.7 % — ABNORMAL HIGH (ref 11.5–14.6)
WBC: 6.1 10*3/uL (ref 4.5–10.5)

## 2013-11-02 LAB — BASIC METABOLIC PANEL
BUN: 9 mg/dL (ref 6–23)
CHLORIDE: 106 meq/L (ref 96–112)
CO2: 24 mEq/L (ref 19–32)
CREATININE: 0.8 mg/dL (ref 0.4–1.2)
Calcium: 9.1 mg/dL (ref 8.4–10.5)
GFR: 85.47 mL/min (ref >60.00)
GLUCOSE: 85 mg/dL (ref 70–99)
POTASSIUM: 3.8 meq/L (ref 3.5–5.1)
Sodium: 140 mEq/L (ref 135–145)

## 2013-11-02 LAB — HEPATIC FUNCTION PANEL
ALBUMIN: 3.9 g/dL (ref 3.5–5.2)
ALT: 20 U/L (ref 0–35)
AST: 23 U/L (ref 0–37)
Alkaline Phosphatase: 118 U/L — ABNORMAL HIGH (ref 39–117)
Bilirubin, Direct: 0 mg/dL (ref 0.0–0.3)
Total Bilirubin: 0.3 mg/dL (ref 0.3–1.2)
Total Protein: 7.5 g/dL (ref 6.0–8.3)

## 2013-11-02 LAB — LIPID PANEL
CHOLESTEROL: 173 mg/dL (ref 0–200)
HDL: 54.9 mg/dL (ref >39.00)
LDL Cholesterol: 92 mg/dL (ref 0–99)
TRIGLYCERIDES: 132 mg/dL (ref 0.0–149.0)
Total CHOL/HDL Ratio: 3
VLDL: 26.4 mg/dL (ref 0.0–40.0)

## 2013-11-02 NOTE — Addendum Note (Signed)
Addended by: Joyce Gross R on: 11/02/2013 08:24 AM   Modules accepted: Orders

## 2013-11-03 ENCOUNTER — Ambulatory Visit
Admission: RE | Admit: 2013-11-03 | Discharge: 2013-11-03 | Disposition: A | Discharge status: Home / Self Care | Payer: BC Managed Care – PPO | Source: Ambulatory Visit

## 2013-11-03 DIAGNOSIS — Z1231 Encounter for screening mammogram for malignant neoplasm of breast: Secondary | ICD-10-CM

## 2013-11-13 ENCOUNTER — Ambulatory Visit (INDEPENDENT_AMBULATORY_CARE_PROVIDER_SITE_OTHER): Payer: BC Managed Care – PPO | Admitting: Family Medicine

## 2013-11-13 ENCOUNTER — Encounter: Payer: Self-pay | Admitting: Family Medicine

## 2013-11-13 ENCOUNTER — Other Ambulatory Visit (HOSPITAL_COMMUNITY)
Admission: RE | Admit: 2013-11-13 | Discharge: 2013-11-13 | Disposition: A | Discharge status: Home / Self Care | Payer: BC Managed Care – PPO | Source: Ambulatory Visit | Attending: Family Medicine | Admitting: Family Medicine

## 2013-11-13 VITALS — BP 136/88 | HR 88 | Temp 98.9°F | Ht 64.75 in | Wt 266.0 lb

## 2013-11-13 DIAGNOSIS — Z01419 Encounter for gynecological examination (general) (routine) without abnormal findings: Secondary | ICD-10-CM | POA: Insufficient documentation

## 2013-11-13 DIAGNOSIS — Z0000 Encounter for general adult medical examination without abnormal findings: Secondary | ICD-10-CM

## 2013-11-13 LAB — POCT URINALYSIS DIPSTICK
Bilirubin, UA: NEGATIVE
Blood, UA: NEGATIVE
Glucose, UA: NEGATIVE
Ketones, UA: NEGATIVE
LEUKOCYTES UA: NEGATIVE
Nitrite, UA: NEGATIVE
PH UA: 7
Spec Grav, UA: 1.015
Urobilinogen, UA: 0.2

## 2013-11-13 NOTE — Progress Notes (Signed)
   Subjective:    Patient ID: Marie Tran, female    DOB: [birth year], 43 y.o.   MRN: [id number]  HPI 43 yr old female for a cpx. She feels well in general. She is still getting rehab to recover from right shoulder surgery in January. 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    Review of Systems  Constitutional: Negative.   HENT: Negative.   Eyes: Negative.   Respiratory: Negative.   Cardiovascular: Negative.   Gastrointestinal: Negative.   Genitourinary: Negative for dysuria, urgency, frequency, hematuria, flank pain, decreased urine volume, enuresis, difficulty urinating, pelvic pain and dyspareunia.  Musculoskeletal: Negative.   Skin: Negative.   Neurological: Negative.   Psychiatric/Behavioral: Negative.        Objective:   Physical Exam  Constitutional: She appears well-developed and well-nourished. No distress.  HENT:  Head: Normocephalic and atraumatic.  Right Ear: External ear normal.  Left Ear: External ear normal.  Nose: Nose normal.  Mouth/Throat: Oropharynx is clear and moist. No oropharyngeal exudate.  Eyes: Conjunctivae and EOM are normal. Pupils are equal, round, and reactive to light. Right eye exhibits no discharge. Left eye exhibits no discharge. No scleral icterus.  Neck: Normal range of motion. Neck supple. No JVD present. No thyromegaly present.  Cardiovascular: Normal rate, regular rhythm, normal heart sounds and intact distal pulses.  Exam reveals no gallop and no friction rub.   No murmur heard. Pulmonary/Chest: Effort normal and breath sounds normal. No stridor. No respiratory distress. She has no wheezes. She has no rales. She exhibits no tenderness.  Abdominal: Soft. Normal appearance and bowel sounds are normal. She exhibits no distension, no abdominal bruit, no ascites and no mass. There is no hepatosplenomegaly. There is no tenderness. There is no rigidity,  no rebound and no guarding. No hernia.  Genitourinary: Rectum normal, vagina normal and uterus normal. No breast swelling, tenderness, discharge or bleeding. Cervix exhibits no motion tenderness, no discharge and no friability. Right adnexum displays no mass, no tenderness and no fullness. Left adnexum displays no mass, no tenderness and no fullness. No erythema, tenderness or bleeding around the vagina. No vaginal discharge found.  Musculoskeletal: Normal range of motion. She exhibits no edema and no tenderness.  Lymphadenopathy:    She has no cervical adenopathy.  Neurological: She is alert. She has normal reflexes. No cranial nerve deficit. She exhibits normal muscle tone. Coordination normal.  Skin: Skin is warm and dry. No rash noted. She is not diaphoretic. No erythema. No pallor.  Psychiatric: She has a normal mood and affect. Her behavior is normal. Judgment and thought content normal.          Assessment & Plan:  Well exam.

## 2013-11-13 NOTE — Progress Notes (Signed)
Pre visit review using our clinic review tool, if applicable. No additional management support is needed unless otherwise documented below in the visit note. 

## 2013-11-14 ENCOUNTER — Encounter: Payer: Self-pay | Admitting: Family Medicine

## 2013-11-15 ENCOUNTER — Telehealth: Payer: Self-pay | Admitting: Family Medicine

## 2013-11-15 NOTE — Telephone Encounter (Signed)
Call in Tramadol 50 mg to take 2 pills every 6 hours prn pain, #240 with 2 rf

## 2013-11-15 NOTE — Telephone Encounter (Signed)
Pt was seen on Monday for cpx and discuss with md she is having back pain. Pt is requesting pain med [facility] to [facility] [place] [address]

## 2013-11-16 MED ORDER — TRAMADOL HCL 50 MG PO TABS: 100.0000 mg | ORAL_TABLET | Freq: Four times a day (QID) | ORAL | Status: DC | PRN

## 2013-11-16 NOTE — Telephone Encounter (Signed)
I called in script and left a voice message for pt.

## 2014-01-29 ENCOUNTER — Other Ambulatory Visit: Payer: Self-pay | Admitting: Family Medicine

## 2014-01-29 NOTE — Telephone Encounter (Signed)
Call in #240 with 2 rf 

## 2014-01-31 ENCOUNTER — Other Ambulatory Visit: Payer: Self-pay | Admitting: Family Medicine

## 2014-01-31 NOTE — Telephone Encounter (Signed)
We did this 2 days ago  

## 2014-03-20 DIAGNOSIS — R490 Dysphonia: Secondary | ICD-10-CM | POA: Insufficient documentation

## 2014-05-04 ENCOUNTER — Other Ambulatory Visit: Payer: Self-pay | Admitting: Family Medicine

## 2014-05-07 NOTE — Telephone Encounter (Signed)
Call in #240 with 2 rf 

## 2014-08-03 ENCOUNTER — Other Ambulatory Visit: Payer: Self-pay | Admitting: Family Medicine

## 2014-08-06 NOTE — Telephone Encounter (Signed)
Levothyroxine sent by e-scribe for 1 year.  Tramadol called to the pharmacy.

## 2014-08-06 NOTE — Telephone Encounter (Signed)
Refill Tramadol #240 with 2 rf, also refill Synthroid for one year

## 2014-09-28 ENCOUNTER — Encounter: Payer: Self-pay | Admitting: Family Medicine

## 2014-09-28 ENCOUNTER — Other Ambulatory Visit: Payer: Self-pay | Admitting: Family Medicine

## 2014-09-28 ENCOUNTER — Ambulatory Visit (INDEPENDENT_AMBULATORY_CARE_PROVIDER_SITE_OTHER): Payer: BC Managed Care – PPO | Admitting: Family Medicine

## 2014-09-28 VITALS — BP 149/85 | HR 96 | Temp 98.5°F | Ht 64.75 in | Wt 270.0 lb

## 2014-09-28 DIAGNOSIS — J0100 Acute maxillary sinusitis, unspecified: Secondary | ICD-10-CM

## 2014-09-28 MED ORDER — AMOXICILLIN-POT CLAVULANATE 875-125 MG PO TABS: 1.0000 | ORAL_TABLET | Freq: Two times a day (BID) | ORAL | Status: DC

## 2014-09-28 MED ORDER — HYDROCODONE-HOMATROPINE 5-1.5 MG/5ML PO SYRP: 5.0000 mL | ORAL_SOLUTION | ORAL | Status: DC | PRN

## 2014-09-28 NOTE — Progress Notes (Signed)
   Subjective:    Patient ID: Clementeen Hoof, female    DOB: 1971/03/06, 44 y.o.   MRN: [id number]  HPI Here for 6 days of sinus pressure, headache, ST, and a dry cough. No fever.    Review of Systems  Constitutional: Negative.   HENT: Positive for congestion, postnasal drip and sinus pressure.   Eyes: Negative.   Respiratory: Positive for cough.        Objective:   Physical Exam  Constitutional: She appears well-nourished.  HENT:  Right Ear: External ear normal.  Left Ear: External ear normal.  Nose: Nose normal.  Mouth/Throat: Oropharynx is clear and moist.  Eyes: Conjunctivae are normal.  Pulmonary/Chest: Effort normal and breath sounds normal.  Lymphadenopathy:    She has no cervical adenopathy.          Assessment & Plan:  Add Mucinex. Written out of work today.

## 2014-09-28 NOTE — Progress Notes (Signed)
Pre visit review using our clinic review tool, if applicable. No additional management support is needed unless otherwise documented below in the visit note. 

## 2014-10-10 ENCOUNTER — Encounter: Payer: Self-pay | Admitting: Family Medicine

## 2014-10-10 ENCOUNTER — Ambulatory Visit (INDEPENDENT_AMBULATORY_CARE_PROVIDER_SITE_OTHER): Payer: BC Managed Care – PPO | Admitting: Family Medicine

## 2014-10-10 VITALS — BP 143/94 | HR 84 | Temp 98.3°F | Ht 64.75 in | Wt 266.0 lb

## 2014-10-10 DIAGNOSIS — M542 Cervicalgia: Secondary | ICD-10-CM

## 2014-10-10 MED ORDER — OXYCODONE-ACETAMINOPHEN 5-325 MG PO TABS: 1.0000 | ORAL_TABLET | ORAL | Status: DC | PRN

## 2014-10-10 NOTE — Progress Notes (Signed)
Pre visit review using our clinic review tool, if applicable. No additional management support is needed unless otherwise documented below in the visit note. 

## 2014-10-10 NOTE — Progress Notes (Signed)
   Subjective:    Patient ID: Marie Tran, female    DOB: Jun 26, 1971, 44 y.o.   MRN: [id number]  HPI Here for several months of right upper back and shoulder pain that has gotten wosre lately. She had an anterior cervical fusion per Dr. Lynann Bologna in March 2014 and then right rotator cuff surgery per Dr. Tamera Punt in 2015. 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 She takes Tramadol every day. She has radiation of this pain down the lateral side and the back of the right arm, and the arm gets numb and tingles at times.    Review of Systems  Constitutional: Negative.   Musculoskeletal: Positive for arthralgias and neck pain.       Objective:   Physical Exam  Constitutional: She appears well-developed and well-nourished.  Musculoskeletal:  She is tender in the posterior neck and in the right upper back towards the scapula. ROM is full in the neck and the right shoulder. The shoulder is not tender.           Assessment & Plan:  She is having radicular pain in the neck, probably at the C6-C7 area just beneath her previous surgical site. Given Percocet for pain. We will send hr back to see Dr. Lynann Bologna soon. She wants to continue working as long as she can.

## 2014-11-05 ENCOUNTER — Other Ambulatory Visit: Payer: Self-pay | Admitting: Family Medicine

## 2014-11-05 NOTE — Telephone Encounter (Signed)
Call in #240 with 2 rf 

## 2014-12-25 ENCOUNTER — Telehealth: Payer: Self-pay | Admitting: Family Medicine

## 2014-12-25 NOTE — Telephone Encounter (Signed)
I spoke with pt and she will contact the provider.

## 2014-12-25 NOTE — Telephone Encounter (Signed)
I have not received any communication from Dr. Othelia Pulling at all. Before I can answer this question about pain meds I need to see all his office notes

## 2014-12-25 NOTE — Telephone Encounter (Addendum)
Pt will be release from dr [facility] neurosurgeon tommorrow . Pt was seeing ns for neck pain. Pt NS would like to know if dr fry will continue to write her hydrocodone rx. Pt has an appt with dr Lynann Bologna tomorrow

## 2015-01-03 ENCOUNTER — Telehealth: Payer: Self-pay | Admitting: Family Medicine

## 2015-01-03 NOTE — Telephone Encounter (Signed)
Pt request refill  HYDROcodone-acetaminophen (VICODIN) 5-325 MG per tablet

## 2015-01-04 MED ORDER — HYDROCODONE-ACETAMINOPHEN 5-325 MG PO TABS: 1.0000 | ORAL_TABLET | Freq: Four times a day (QID) | ORAL | Status: DC | PRN

## 2015-01-04 NOTE — Telephone Encounter (Signed)
done

## 2015-01-07 NOTE — Telephone Encounter (Signed)
Script is ready for pick up and I spoke with pt.  

## 2015-01-11 ENCOUNTER — Other Ambulatory Visit: Payer: Self-pay

## 2015-01-11 DIAGNOSIS — Z1231 Encounter for screening mammogram for malignant neoplasm of breast: Secondary | ICD-10-CM

## 2015-01-21 ENCOUNTER — Telehealth: Payer: Self-pay | Admitting: Family Medicine

## 2015-01-21 MED ORDER — HYDROCODONE-ACETAMINOPHEN 5-325 MG PO TABS: 1.0000 | ORAL_TABLET | Freq: Four times a day (QID) | ORAL | Status: DC | PRN

## 2015-01-21 NOTE — Telephone Encounter (Signed)
Pt needs new rx hydrocodone °

## 2015-01-21 NOTE — Telephone Encounter (Signed)
done

## 2015-01-21 NOTE — Telephone Encounter (Signed)
Pt aware Rx ready for pick up 

## 2015-01-22 ENCOUNTER — Ambulatory Visit: Payer: Self-pay

## 2015-01-29 ENCOUNTER — Ambulatory Visit
Admission: RE | Admit: 2015-01-29 | Discharge: 2015-01-29 | Disposition: A | Discharge status: Home / Self Care | Payer: BC Managed Care – PPO | Source: Ambulatory Visit

## 2015-01-29 DIAGNOSIS — Z1231 Encounter for screening mammogram for malignant neoplasm of breast: Secondary | ICD-10-CM

## 2015-02-11 ENCOUNTER — Telehealth: Payer: Self-pay | Admitting: Family Medicine

## 2015-02-11 NOTE — Telephone Encounter (Signed)
Pt request refill of the following: HYDROcodone-acetaminophen (NORCO) 5-325 MG per tablet   Phamacy:

## 2015-02-12 MED ORDER — HYDROCODONE-ACETAMINOPHEN 5-325 MG PO TABS
1.0000 | ORAL_TABLET | Freq: Four times a day (QID) | ORAL | Status: DC | PRN
Start: 2015-02-12 — End: 2015-03-18

## 2015-02-12 NOTE — Telephone Encounter (Signed)
Done, but I dated it for 02-20-15

## 2015-02-12 NOTE — Telephone Encounter (Signed)
Script is ready for pick up and I spoke with pt.  

## 2015-03-18 ENCOUNTER — Telehealth: Payer: Self-pay | Admitting: Family Medicine

## 2015-03-18 MED ORDER — HYDROCODONE-ACETAMINOPHEN 5-325 MG PO TABS: 1.0000 | ORAL_TABLET | Freq: Four times a day (QID) | ORAL | Status: DC | PRN

## 2015-03-18 NOTE — Telephone Encounter (Signed)
done

## 2015-03-18 NOTE — Telephone Encounter (Signed)
Pt request refill HYDROcodone-acetaminophen (NORCO) 5-325 MG per tablet

## 2015-03-19 NOTE — Telephone Encounter (Signed)
Script is ready for pick up and I left a voice message for pt. 

## 2015-05-17 ENCOUNTER — Other Ambulatory Visit: Payer: Self-pay | Admitting: Internal Medicine

## 2015-05-29 ENCOUNTER — Telehealth: Payer: Self-pay | Admitting: Family Medicine

## 2015-05-29 NOTE — Telephone Encounter (Signed)
Pt has been sch

## 2015-05-29 NOTE — Telephone Encounter (Signed)
Okay, per Dr. Sarajane Jews to schedule.

## 2015-05-29 NOTE — Telephone Encounter (Signed)
Pt would like cpx on oct 31-16. Can I create 30 min slot?

## 2015-06-11 ENCOUNTER — Other Ambulatory Visit: Payer: Self-pay | Admitting: Family Medicine

## 2015-06-11 MED ORDER — HYDROCODONE-ACETAMINOPHEN 5-325 MG PO TABS: 1.0000 | ORAL_TABLET | Freq: Four times a day (QID) | ORAL | Status: DC | PRN

## 2015-06-11 NOTE — Telephone Encounter (Signed)
done

## 2015-06-11 NOTE — Telephone Encounter (Signed)
Patient would like her Hydrocodone prescription refilled. °

## 2015-06-12 NOTE — Telephone Encounter (Signed)
Called and spoke with pt and pt is aware.  Pt's sister Lewayne Bunting will pick up rx.

## 2015-06-17 ENCOUNTER — Encounter: Payer: Self-pay | Admitting: Family Medicine

## 2015-06-17 ENCOUNTER — Ambulatory Visit (INDEPENDENT_AMBULATORY_CARE_PROVIDER_SITE_OTHER): Payer: BC Managed Care – PPO | Admitting: Family Medicine

## 2015-06-17 ENCOUNTER — Other Ambulatory Visit (HOSPITAL_COMMUNITY)
Admission: RE | Admit: 2015-06-17 | Discharge: 2015-06-17 | Disposition: A | Discharge status: Home / Self Care | Payer: BC Managed Care – PPO | Source: Ambulatory Visit | Attending: Family Medicine | Admitting: Family Medicine

## 2015-06-17 VITALS — BP 131/88 | HR 80 | Temp 98.7°F | Ht 64.75 in | Wt 268.0 lb

## 2015-06-17 DIAGNOSIS — J329 Chronic sinusitis, unspecified: Secondary | ICD-10-CM | POA: Diagnosis not present

## 2015-06-17 DIAGNOSIS — Z0000 Encounter for general adult medical examination without abnormal findings: Secondary | ICD-10-CM | POA: Diagnosis not present

## 2015-06-17 DIAGNOSIS — Z01419 Encounter for gynecological examination (general) (routine) without abnormal findings: Secondary | ICD-10-CM | POA: Insufficient documentation

## 2015-06-17 DIAGNOSIS — M542 Cervicalgia: Secondary | ICD-10-CM

## 2015-06-17 DIAGNOSIS — G8929 Other chronic pain: Secondary | ICD-10-CM | POA: Diagnosis not present

## 2015-06-17 DIAGNOSIS — R748 Abnormal levels of other serum enzymes: Secondary | ICD-10-CM

## 2015-06-17 LAB — HEPATIC FUNCTION PANEL
ALT: 18 U/L (ref 0–35)
AST: 20 U/L (ref 0–37)
Albumin: 4 g/dL (ref 3.5–5.2)
Alkaline Phosphatase: 130 U/L — ABNORMAL HIGH (ref 39–117)
BILIRUBIN TOTAL: 0.4 mg/dL (ref 0.2–1.2)
Bilirubin, Direct: 0.1 mg/dL (ref 0.0–0.3)
Total Protein: 7.4 g/dL (ref 6.0–8.3)

## 2015-06-17 LAB — BASIC METABOLIC PANEL
BUN: 9 mg/dL (ref 6–23)
CHLORIDE: 103 meq/L (ref 96–112)
CO2: 28 mEq/L (ref 19–32)
Calcium: 9.5 mg/dL (ref 8.4–10.5)
Creatinine, Ser: 0.87 mg/dL (ref 0.40–1.20)
GFR: 74.8 mL/min (ref >60.00)
GLUCOSE: 83 mg/dL (ref 70–99)
Potassium: 4.5 mEq/L (ref 3.5–5.1)
Sodium: 141 mEq/L (ref 135–145)

## 2015-06-17 LAB — CBC WITH DIFFERENTIAL/PLATELET
BASOS ABS: 0 10*3/uL (ref 0.0–0.1)
BASOS PCT: 0.6 % (ref 0.0–3.0)
EOS PCT: 4.8 % (ref 0.0–5.0)
Eosinophils Absolute: 0.4 10*3/uL (ref 0.0–0.7)
HCT: 36.3 % (ref 36.0–46.0)
Hemoglobin: 11.2 g/dL — ABNORMAL LOW (ref 12.0–15.0)
LYMPHS ABS: 3.1 10*3/uL (ref 0.7–4.0)
Lymphocytes Relative: 39.2 % (ref 12.0–46.0)
MCHC: 30.8 g/dL (ref 30.0–36.0)
MCV: 81.5 fl (ref 78.0–100.0)
Monocytes Absolute: 0.6 10*3/uL (ref 0.1–1.0)
Monocytes Relative: 7.3 % (ref 3.0–12.0)
NEUTROS PCT: 48.1 % (ref 43.0–77.0)
Neutro Abs: 3.8 10*3/uL (ref 1.4–7.7)
PLATELETS: 311 10*3/uL (ref 150.0–400.0)
RBC: 4.45 Mil/uL (ref 3.87–5.11)
RDW: 17.7 % — AB (ref 11.5–15.5)
WBC: 7.9 10*3/uL (ref 4.0–10.5)

## 2015-06-17 LAB — POCT URINALYSIS DIPSTICK
Bilirubin, UA: NEGATIVE
Blood, UA: NEGATIVE
Glucose, UA: NEGATIVE
KETONES UA: NEGATIVE
Leukocytes, UA: NEGATIVE
Nitrite, UA: NEGATIVE
PROTEIN UA: NEGATIVE
Spec Grav, UA: 1.025
Urobilinogen, UA: 0.2
pH, UA: 6.5

## 2015-06-17 LAB — LIPID PANEL
CHOL/HDL RATIO: 5
CHOLESTEROL: 269 mg/dL — AB (ref 0–200)
HDL: 56.6 mg/dL (ref >39.00)
LDL Cholesterol: 181 mg/dL — ABNORMAL HIGH (ref 0–99)
NonHDL: 212.14
TRIGLYCERIDES: 154 mg/dL — AB (ref 0.0–149.0)
VLDL: 30.8 mg/dL (ref 0.0–40.0)

## 2015-06-17 LAB — TSH: TSH: 98.27 u[IU]/mL — ABNORMAL HIGH (ref 0.35–4.50)

## 2015-06-17 MED ORDER — LEVOTHYROXINE SODIUM 150 MCG PO TABS: 150.0000 ug | ORAL_TABLET | Freq: Every day | ORAL | Status: DC

## 2015-06-17 MED ORDER — PHENTERMINE HCL 37.5 MG PO TABS: 37.5000 mg | ORAL_TABLET | Freq: Every day | ORAL | Status: DC

## 2015-06-17 MED ORDER — OMEPRAZOLE 40 MG PO CPDR: 40.0000 mg | DELAYED_RELEASE_CAPSULE | Freq: Every evening | ORAL | Status: DC

## 2015-06-17 MED ORDER — AMOXICILLIN-POT CLAVULANATE 875-125 MG PO TABS: 1.0000 | ORAL_TABLET | Freq: Two times a day (BID) | ORAL | Status: DC

## 2015-06-17 MED ORDER — ATORVASTATIN CALCIUM 40 MG PO TABS: 40.0000 mg | ORAL_TABLET | Freq: Every day | ORAL | Status: DC

## 2015-06-17 NOTE — Progress Notes (Signed)
Subjective:    Patient ID: Marie Tran, female    DOB: [birth year], 44 y.o.   MRN: [id number]  HPI 44 yr old female for a cpx, and she has other issues to discuss. For the past week she has had sinus pressure, left ear pain, and a dry cough. No fever. Lastly she continues to have right sided neck pain and right shoulder pain, with pain and weakness radiating into the right arm. Her neck surgeon (Dr. Othelia Pulling) saysit is coming from the shoulder, and her shoulder surgeon (Dr. Tamera Punt) says it is coming from her neck. She is not sure what to do at this point.    Review of Systems  Constitutional: Negative.  Negative for fever, diaphoresis, activity change, appetite change, fatigue and unexpected weight change.  HENT: Negative.  Negative for congestion, ear pain, hearing loss, nosebleeds, sore throat, tinnitus, trouble swallowing and voice change.   Eyes: Negative.  Negative for photophobia, pain, discharge, redness and visual disturbance.  Respiratory: Negative.  Negative for apnea, cough, choking, chest tightness, shortness of breath, wheezing and stridor.   Cardiovascular: Negative.  Negative for chest pain, palpitations and leg swelling.  Gastrointestinal: Negative.  Negative for nausea, vomiting, abdominal pain, diarrhea, constipation, blood in stool, abdominal distention and rectal pain.  Genitourinary: Negative.  Negative for dysuria, urgency, frequency, hematuria, flank pain, vaginal bleeding, vaginal discharge, enuresis, difficulty urinating, vaginal pain and menstrual problem.  Musculoskeletal: Positive for neck pain. Negative for myalgias, back pain, joint swelling, arthralgias, gait problem and neck stiffness.  Skin: Negative.  Negative for color change, pallor, rash and wound.  Neurological: Negative.  Negative for dizziness, tremors, seizures, syncope, speech difficulty, weakness, light-headedness, numbness and headaches.  Hematological: Negative for adenopathy. Does not  bruise/bleed easily.  Psychiatric/Behavioral: Negative.  Negative for hallucinations, behavioral problems, confusion, sleep disturbance, dysphoric mood and agitation. The patient is not nervous/anxious.        Objective:   Physical Exam  Constitutional: She appears well-developed and well-nourished. No distress.  HENT:  Head: Normocephalic and atraumatic.  Right Ear: External ear normal.  Left Ear: External ear normal.  Nose: Nose normal.  Mouth/Throat: Oropharynx is clear and moist. No oropharyngeal exudate.  Eyes: Conjunctivae and EOM are normal. Pupils are equal, round, and reactive to light. Right eye exhibits no discharge. Left eye exhibits no discharge. No scleral icterus.  Neck: Normal range of motion. Neck supple. No JVD present. No thyromegaly present.  Cardiovascular: Normal rate, regular rhythm, normal heart sounds and intact distal pulses.  Exam reveals no gallop and no friction rub.   No murmur heard. Pulmonary/Chest: Effort normal and breath sounds normal. No stridor. No respiratory distress. She has no wheezes. She has no rales. She exhibits no tenderness.  Abdominal: Soft. Normal appearance and bowel sounds are normal. She exhibits no distension, no abdominal bruit, no ascites and no mass. There is no hepatosplenomegaly. There is no tenderness. There is no rigidity, no rebound and no guarding. No hernia.  Genitourinary: Rectum normal, vagina normal and uterus normal. No breast swelling, tenderness, discharge or bleeding. Cervix exhibits no motion tenderness, no discharge and no friability. Right adnexum displays no mass, no tenderness and no fullness. Left adnexum displays no mass, no tenderness and no fullness. No erythema, tenderness or bleeding in the vagina. No vaginal discharge found.  Musculoskeletal: Normal range of motion. She exhibits no edema or tenderness.  Lymphadenopathy:    She has no cervical adenopathy.  Neurological: She is alert. She has normal reflexes. No  cranial nerve deficit. She exhibits normal muscle tone. Coordination normal.  Skin: Skin is warm and dry. No rash noted. She is not diaphoretic. No erythema. No pallor.  Psychiatric: She has a normal mood and affect. Her behavior is normal. Judgment and thought content normal.          Assessment & Plan:  Well exam. Get fasting labs. We discussed diet and exercise. Treat the sinusitis with Augmentin. Refer to Neurosurgery for a third opinion about the source of her neck and shoulder pains.

## 2015-06-17 NOTE — Progress Notes (Signed)
Pre visit review using our clinic review tool, if applicable. No additional management support is needed unless otherwise documented below in the visit note. 

## 2015-06-18 ENCOUNTER — Other Ambulatory Visit: Payer: Self-pay | Admitting: Family Medicine

## 2015-06-18 DIAGNOSIS — E039 Hypothyroidism, unspecified: Secondary | ICD-10-CM

## 2015-06-18 MED ORDER — LEVOTHYROXINE SODIUM 200 MCG PO TABS: 200.0000 ug | ORAL_TABLET | Freq: Every day | ORAL | Status: DC

## 2015-06-18 NOTE — Addendum Note (Signed)
Addended by: [name] A on: 06/18/2015 08:51 AM   Modules accepted: Orders

## 2015-06-20 LAB — CYTOLOGY - PAP

## 2015-06-24 ENCOUNTER — Ambulatory Visit
Admission: RE | Admit: 2015-06-24 | Discharge: 2015-06-24 | Disposition: A | Discharge status: Home / Self Care | Payer: BC Managed Care – PPO | Source: Ambulatory Visit | Attending: Family Medicine | Admitting: Family Medicine

## 2015-06-24 DIAGNOSIS — R748 Abnormal levels of other serum enzymes: Secondary | ICD-10-CM | POA: Diagnosis present

## 2015-06-25 ENCOUNTER — Telehealth: Payer: Self-pay | Admitting: Family Medicine

## 2015-06-25 NOTE — Telephone Encounter (Signed)
Pt call to say that she received a call from GI dept and is asking why she need to see GI. She said she would like to know the purpose of having this test done. Would like a call back .

## 2015-06-25 NOTE — Addendum Note (Signed)
Addended by: [name] A on: 06/25/2015 01:59 PM   Modules accepted: Orders

## 2015-06-25 NOTE — Telephone Encounter (Signed)
I spoke with pt and gave results.  

## 2015-07-02 ENCOUNTER — Encounter: Payer: Self-pay | Admitting: Gastroenterology

## 2015-07-02 ENCOUNTER — Other Ambulatory Visit (INDEPENDENT_AMBULATORY_CARE_PROVIDER_SITE_OTHER): Payer: BC Managed Care – PPO

## 2015-07-02 ENCOUNTER — Ambulatory Visit (INDEPENDENT_AMBULATORY_CARE_PROVIDER_SITE_OTHER): Payer: BC Managed Care – PPO | Admitting: Gastroenterology

## 2015-07-02 DIAGNOSIS — R748 Abnormal levels of other serum enzymes: Secondary | ICD-10-CM

## 2015-07-02 DIAGNOSIS — K760 Fatty (change of) liver, not elsewhere classified: Secondary | ICD-10-CM | POA: Diagnosis not present

## 2015-07-02 LAB — IBC PANEL
Iron: 39 ug/dL — ABNORMAL LOW (ref 42–145)
Saturation Ratios: 7.3 % — ABNORMAL LOW (ref 20.0–50.0)
Transferrin: 380 mg/dL — ABNORMAL HIGH (ref 212.0–360.0)

## 2015-07-02 LAB — HEMOGLOBIN A1C: HEMOGLOBIN A1C: 6.1 % (ref 4.6–6.5)

## 2015-07-02 LAB — PROTIME-INR
INR: 1 ratio (ref 0.8–1.0)
Prothrombin Time: 11.1 s (ref 9.6–13.1)

## 2015-07-02 LAB — FERRITIN: FERRITIN: 4.9 ng/mL — AB (ref 10.0–291.0)

## 2015-07-02 NOTE — Progress Notes (Signed)
HPI :  44 y/o female seen in consultation for elevated liver enzymes from Dr. [name].  She reports she was noted to have "elevated liver enzymes" during her recent physical and referred to [place] after an abnormal US of the liver. She is aware of this for the past 2 weeks or so. No prior history of known liver disease. Father had history of metastatic bladder cancer to the liver. Brother has had fatty liver and alcoholism, and another brother had fatty liver. No cirrhosis known. No history of jaundice. She has a URI and was given Augmentin but she has not taken it. She does not drink any alcohol routinely or at all. She endorses 20 lbs of weight gain over the past year.   She is taking liipitor at 40mg , no dosing change at all recently. She takes norco PRN, does not take routinely. She takes only seldom NSAIDs. She takes vitamin E supplement 800mg  q AM for what she reports is fibrocystic breast disease. No other herbal supplements.   She has had an US of the liver showing fatty infiltration. Recent labs as outlined below.     Past Medical History  Diagnosis Date  . Migraines   . Hyperlipidemia   . Hypothyroidism   . Overactive bladder   . Obesity   . Breast lump     right breast, UOQ, intramammary lymph node, also left breast cyst   . Neck pain, chronic     sees Dr. Phylliss Bob  . Hoarseness   . Wears dentures     top  . Snores   . PONV (postoperative nausea and vomiting)     had to have scope patch last surgery  . Complication of anesthesia     hard to wake up after lap choli  . Fatty liver disease, nonalcoholic      Past Surgical History  Procedure Laterality Date  . Tubal ligation    . Cholecystectomy  2009  . Cervical disc surgery  3/14    at C5-C6 per Dr. Lynann Bologna   . Rotator cuff repair w/ distal clavicle excision Right 09-05-13    per Dr. Tamera Punt    Family History  Problem Relation Age of Onset  . Hypertension Other   . Cancer Other    ovarian,uterine,breast  . Stroke Other     female 1st degree relative <50  . Cancer Mother     breast  . Colon polyps Mother   . Cancer Brother     brain  . Brain cancer Brother   . Bladder Cancer Father   . Leukemia Father   . Colon polyps Father    Social History  Substance Use Topics  . Smoking status: Former Smoker    Quit date: 09/08/1988  . Smokeless tobacco: Never Used  . Alcohol Use: No   Current Outpatient Prescriptions  Medication Sig Dispense Refill  . amoxicillin-clavulanate (AUGMENTIN) 875-125 MG tablet Take 1 tablet by mouth 2 (two) times daily. 20 tablet 0  . atorvastatin (LIPITOR) 40 MG tablet Take 1 tablet (40 mg total) by mouth daily. 90 tablet 3  . docusate sodium (COLACE) 100 MG capsule Take 1 capsule (100 mg total) by mouth 3 (three) times daily as needed. 20 capsule 0  . HYDROcodone-acetaminophen (NORCO) 5-325 MG tablet Take 1 tablet by mouth every 6 (six) hours as needed for moderate pain. 120 tablet 0  . ibuprofen (ADVIL,MOTRIN) 800 MG tablet TAKE 1 TABLET BY MOUTH EVERY 6 HOURS AS NEEDED 120 tablet  6  . levothyroxine (SYNTHROID) 200 MCG tablet Take 1 tablet (200 mcg total) by mouth daily before breakfast. 90 tablet 3  . Multiple Vitamins-Minerals (MULTIVITAMIN WITH MINERALS) tablet Take 1 tablet by mouth daily.    Marland Kitchen omeprazole (PRILOSEC) 40 MG capsule Take 1 capsule (40 mg total) by mouth every evening. 90 capsule 3  . phentermine (ADIPEX-P) 37.5 MG tablet Take 1 tablet (37.5 mg total) by mouth daily before breakfast. 90 tablet 1   No current facility-administered medications for this visit.   No Known Allergies   Review of Systems: All systems reviewed and negative except where noted in HPI.    US Abdomen Complete  06/24/2015  CLINICAL DATA:  Elevated alkaline phosphatase. EXAM: ULTRASOUND ABDOMEN COMPLETE COMPARISON:  None. FINDINGS: Gallbladder: Cholecystectomy. Common bile duct: Diameter: 6.1 mm Liver: Liver is echodense consistent with fatty  infiltration and/or hepatocellular disease. No focal abnormality identified. IVC: No abnormality visualized. Pancreas: Visualized portion unremarkable. Spleen: Size and appearance within normal limits. Right Kidney: Length: 11.6 cm. Echogenicity within normal limits. No mass or hydronephrosis visualized. Left Kidney: Length: 12.2 cm. Echogenicity within normal limits. No mass or hydronephrosis visualized. Abdominal aorta: No aneurysm visualized. Other findings: None. IMPRESSION: 1. Cholecystectomy.  No biliary distention. 2. Echodense liver suggesting fatty infiltration and/or hepatocellular disease. Electronically Signed   By: Marcello Moores  Register   On: 06/24/2015 08:33   Lab Results  Component Value Date   WBC 7.9 06/17/2015   HGB 11.2* 06/17/2015   HCT 36.3 06/17/2015   MCV 81.5 06/17/2015   PLT 311.0 06/17/2015    Lab Results  Component Value Date   ALT 18 06/17/2015   AST 20 06/17/2015   ALKPHOS 130* 06/17/2015   BILITOT 0.4 06/17/2015    Lab Results  Component Value Date   CREATININE 0.87 06/17/2015   BUN 9 06/17/2015   NA 141 06/17/2015   K 4.5 06/17/2015   CL 103 06/17/2015   CO2 28 06/17/2015    No results found for: INR, PROTIME    Physical Exam: BP 136/94 mmHg  Pulse 88  Ht 5' 4.75" (1.645 m)  Wt 269 lb 3.2 oz (122.108 kg)  BMI 45.12 kg/m2  LMP  (LMP Unknown) Constitutional: Pleasant,well-developed, female in no acute distress. HEENT: Normocephalic and atraumatic. Conjunctivae are normal. No scleral icterus. Neck supple.  Cardiovascular: Normal rate, regular rhythm.  Pulmonary/chest: Effort normal and breath sounds normal. No wheezing, rales or rhonchi. Abdominal: Soft, protuberant, nontender. Bowel sounds active throughout. There are no masses palpable. No hepatomegaly. Extremities: no edema Lymphadenopathy: No cervical adenopathy noted. Neurological: Alert and oriented to person place and time. Skin: Skin is warm and dry. No rashes noted. Psychiatric: Normal  mood and affect. Behavior is normal.   ASSESSMENT AND PLAN: 44 y/o female seen in consultation for elevated liver enzymes. Her ALT was mildly elevated previously and now normal, with mild elevation of AP. US shows fatty liver.   I discussed differential for elevated liver enzymes with her. Given her recent weight gain, lack of alcohol use, and US findings, I suspect NAFLD is driving this mild elevation in her enzymes. However, I do recommend basic labs to screen for other chronic liver diseases to ensure negative, to include AMA in light of AP elevation. I would also recommend she be screen for diabetes given HLD and fatty liver disease.   We discussed the spectrum of liver disease with NAFLD to include risks of NASH and cirrhosis, and metabolic syndrome. Moving forward recommend goal weight loss  via diet and exercise to normalize BMI and hopefully reverse or at least prevent progression of disease. Otherwise, recommend routine coffee intake which can help prevent fibrotic changes of the liver associated with NAFLD. She will continue to avoid alcohol. In regards to her vitamin E use, while this can be used in NASH patients who do not have DM, I don't recommend it routinely given some of the associated risks. I asked her to speak with her prescribing doctor about this in regards to its long term indication, but for her liver I don't think she needs to continue at this time unless we think she develops NASH.   I will otherwise await labs and let her know the results. Otherwise repeat LFTs in 6 months and follow up in clinic at least once yearly to address NAFLD.  Krupp Cellar, MD [facility] Pager [phone]  CC: Dr. [name]

## 2015-07-02 NOTE — Patient Instructions (Signed)
Your physician has requested that you go to the basement for  lab work before leaving today.   

## 2015-07-03 LAB — MITOCHONDRIAL ANTIBODIES: Mitochondrial M2 Ab, IgG: 0.22 (ref <0.91)

## 2015-07-03 LAB — HEPATITIS B SURFACE ANTIGEN: Hepatitis B Surface Ag: NEGATIVE

## 2015-07-03 LAB — IGG: IgG (Immunoglobin G), Serum: 1400 mg/dL (ref 690–1700)

## 2015-07-03 LAB — HEPATITIS B SURFACE ANTIBODY,QUALITATIVE: HEP B S AB: POSITIVE — AB

## 2015-07-03 LAB — HEPATITIS A ANTIBODY, TOTAL: Hep A Total Ab: NONREACTIVE

## 2015-07-03 LAB — HEPATITIS C ANTIBODY: HCV AB: NEGATIVE

## 2015-07-03 LAB — ANA: Anti Nuclear Antibody(ANA): NEGATIVE

## 2015-07-04 LAB — ALPHA-1-ANTITRYPSIN: A-1 Antitrypsin, Ser: 163 mg/dL (ref 83–199)

## 2015-07-04 LAB — CERULOPLASMIN: CERULOPLASMIN: 37 mg/dL (ref 18–53)

## 2015-07-05 ENCOUNTER — Telehealth: Payer: Self-pay | Admitting: Gastroenterology

## 2015-07-05 DIAGNOSIS — D509 Iron deficiency anemia, unspecified: Secondary | ICD-10-CM

## 2015-07-05 LAB — ANTI-SMOOTH MUSCLE ANTIBODY, IGG: Smooth Muscle Ab: 14 U (ref <20)

## 2015-07-05 MED ORDER — FERROUS SULFATE 325 (65 FE) MG PO TABS: 325.0000 mg | ORAL_TABLET | Freq: Every day | ORAL | Status: DC

## 2015-07-05 NOTE — Telephone Encounter (Signed)
See lab result for full discussion of results.

## 2015-07-24 ENCOUNTER — Other Ambulatory Visit: Payer: Self-pay | Admitting: Family Medicine

## 2015-07-24 NOTE — Telephone Encounter (Signed)
[name] called asking if Dr. Sarajane Jews would send the same Rx for her that he sent when she had her CPE in October due to a sinus infection. She said her symptoms have returned and her cough is worse at night. Please let her know if she needs to come in.  Pt's ph# [phone] Thank you.

## 2015-07-24 NOTE — Telephone Encounter (Signed)
Call in Augmentin 875 bid for 10 days  

## 2015-07-25 MED ORDER — AMOXICILLIN-POT CLAVULANATE 875-125 MG PO TABS: 1.0000 | ORAL_TABLET | Freq: Two times a day (BID) | ORAL | Status: DC

## 2015-07-25 NOTE — Telephone Encounter (Signed)
Rx was sent  

## 2015-08-07 ENCOUNTER — Telehealth: Payer: Self-pay | Admitting: Family Medicine

## 2015-08-07 NOTE — Telephone Encounter (Signed)
Note was written and faxed to pharmacy to approve the early refill.

## 2015-08-07 NOTE — Telephone Encounter (Signed)
She has a refill for Vicodin dated for 08-11-15 but they are leaving town on the 23rd. Please call [facility] and ask them to fill this 2 days early.

## 2015-09-10 ENCOUNTER — Telehealth: Payer: Self-pay | Admitting: Family Medicine

## 2015-09-10 MED ORDER — HYDROCODONE-ACETAMINOPHEN 5-325 MG PO TABS: 1.0000 | ORAL_TABLET | Freq: Four times a day (QID) | ORAL | Status: DC | PRN

## 2015-09-10 NOTE — Telephone Encounter (Signed)
done

## 2015-09-10 NOTE — Telephone Encounter (Signed)
Pt request refill of the following: HYDROcodone-acetaminophen (NORCO) 5-325 MG tablet   Phamacy:

## 2015-09-11 NOTE — Telephone Encounter (Signed)
Script is ready for pick up and I spoke with pt.  

## 2015-09-13 ENCOUNTER — Encounter: Payer: Self-pay | Admitting: Gastroenterology

## 2015-09-19 ENCOUNTER — Emergency Department
Admission: EM | Admit: 2015-09-19 | Discharge: 2015-09-19 | Disposition: A | Discharge status: Home / Self Care | Payer: BC Managed Care – PPO | Attending: Emergency Medicine | Admitting: Emergency Medicine

## 2015-09-19 ENCOUNTER — Emergency Department: Payer: BC Managed Care – PPO

## 2015-09-19 ENCOUNTER — Encounter: Payer: Self-pay | Admitting: Emergency Medicine

## 2015-09-19 DIAGNOSIS — Y998 Other external cause status: Secondary | ICD-10-CM | POA: Insufficient documentation

## 2015-09-19 DIAGNOSIS — Z87891 Personal history of nicotine dependence: Secondary | ICD-10-CM | POA: Insufficient documentation

## 2015-09-19 DIAGNOSIS — Z79899 Other long term (current) drug therapy: Secondary | ICD-10-CM | POA: Diagnosis not present

## 2015-09-19 DIAGNOSIS — Y9289 Other specified places as the place of occurrence of the external cause: Secondary | ICD-10-CM | POA: Insufficient documentation

## 2015-09-19 DIAGNOSIS — Z792 Long term (current) use of antibiotics: Secondary | ICD-10-CM | POA: Diagnosis not present

## 2015-09-19 DIAGNOSIS — S335XXA Sprain of ligaments of lumbar spine, initial encounter: Secondary | ICD-10-CM | POA: Insufficient documentation

## 2015-09-19 DIAGNOSIS — R202 Paresthesia of skin: Secondary | ICD-10-CM | POA: Diagnosis not present

## 2015-09-19 DIAGNOSIS — X58XXXA Exposure to other specified factors, initial encounter: Secondary | ICD-10-CM | POA: Insufficient documentation

## 2015-09-19 DIAGNOSIS — S3992XA Unspecified injury of lower back, initial encounter: Secondary | ICD-10-CM | POA: Diagnosis present

## 2015-09-19 DIAGNOSIS — Z3202 Encounter for pregnancy test, result negative: Secondary | ICD-10-CM | POA: Insufficient documentation

## 2015-09-19 DIAGNOSIS — Y9389 Activity, other specified: Secondary | ICD-10-CM | POA: Insufficient documentation

## 2015-09-19 DIAGNOSIS — Z791 Long term (current) use of non-steroidal anti-inflammatories (NSAID): Secondary | ICD-10-CM | POA: Insufficient documentation

## 2015-09-19 LAB — POCT PREGNANCY, URINE: Preg Test, Ur: NEGATIVE

## 2015-09-19 MED ORDER — ETODOLAC 400 MG PO TABS: 400.0000 mg | ORAL_TABLET | Freq: Two times a day (BID) | ORAL | Status: DC

## 2015-09-19 MED ORDER — OXYCODONE-ACETAMINOPHEN 5-325 MG PO TABS
1.0000 | ORAL_TABLET | Freq: Once | ORAL | Status: AC
  Administered 2015-09-19: 1 via ORAL
  Filled 2015-09-19: qty 1

## 2015-09-19 MED ORDER — KETOROLAC TROMETHAMINE 60 MG/2ML IM SOLN
60.0000 mg | Freq: Once | INTRAMUSCULAR | Status: AC
  Administered 2015-09-19: 60 mg via INTRAMUSCULAR
  Filled 2015-09-19: qty 2

## 2015-09-19 MED ORDER — DIAZEPAM 5 MG PO TABS
5.0000 mg | ORAL_TABLET | Freq: Once | ORAL | Status: AC
  Administered 2015-09-19: 5 mg via ORAL
  Filled 2015-09-19: qty 1

## 2015-09-19 MED ORDER — DIAZEPAM 2 MG PO TABS: 2.0000 mg | ORAL_TABLET | Freq: Three times a day (TID) | ORAL | Status: DC | PRN

## 2015-09-19 NOTE — Discharge Instructions (Signed)
Follow-up with your doctor in [place] if you have continued problems. Continue taking your pain medication as directed. Take etodolac twice a day with food and diazepam 2 mg every 8 hours as needed for muscle spasms. Do not drive while taking this medication. Ice or heat to her back to reduce pain.

## 2015-09-19 NOTE — ED Notes (Signed)
Pt has had bulging discs in lower back for years, states yesterday she was standing up to get out of seat, felt a pop in her lower back and has been having pain ever since.

## 2015-09-19 NOTE — ED Notes (Signed)
See triage   States she felt a pop in lower back yesterday while turning  States pain is mainly on the left side  Unable to bear wt or lift leg

## 2015-09-19 NOTE — ED Provider Notes (Signed)
[facility] Emergency Department Provider Note  ____________________________________________  Time seen: Approximately 8:45 AM  I have reviewed the triage vital signs and the nursing notes.   HISTORY  Chief Complaint Back Pain   HPI Marie Tran is a 45 y.o. female 0 complaining of low back pain. Patient states that she felt a "pop" in her lower back yesterday when she was turning to get off the bus. Patient states she is unable to bear weight or lift her legs. Patient states she has a history of "bulging disc" in her lower back for "years". She states that she sees Dr. Velna Hatchet in [place] where she receives Norco for pain but this is not helping and her last dose of Norco was last p.m. She denies any incontinence of bowel or bladder.She denies back surgery but has had cervical disc surgery. Movement increases her pain and she is unable to find a comfortable place other than sitting with no movement. Currently her pain is rated at 9/10.   Past Medical History  Diagnosis Date  . Migraines   . Hyperlipidemia   . Hypothyroidism   . Overactive bladder   . Obesity   . Breast lump     right breast, UOQ, intramammary lymph node, also left breast cyst   . Neck pain, chronic     sees Dr. Phylliss Bob  . Hoarseness   . Wears dentures     top  . Snores   . PONV (postoperative nausea and vomiting)     had to have scope patch last surgery  . Complication of anesthesia     hard to wake up after lap choli  . Fatty liver disease, nonalcoholic     Patient Active Problem List   Diagnosis Date Noted  . Fatty liver disease, nonalcoholic [id number]  . Chronic neck pain 06/17/2015  . OVERWEIGHT 07/24/2009  . LUMP OR MASS IN BREAST 07/24/2009  . OVERACTIVE BLADDER 05/31/2009  . PERIMENOPAUSAL SYNDROME 05/31/2009  . LOW BACK PAIN, CHRONIC 04/25/2008  . Hypothyroidism 10/06/2007  . HYPERLIPIDEMIA 10/06/2007  . CERVICAL STRAIN 10/06/2007    Past Surgical  History  Procedure Laterality Date  . Tubal ligation    . Cholecystectomy  2009  . Cervical disc surgery  3/14    at [facility] per Dr. Lynann Bologna   . Rotator cuff repair w/ distal clavicle excision Right 09-05-13    per Dr. Tamera Punt     Current Outpatient Rx  Name  Route  Sig  Dispense  Refill  . amoxicillin-clavulanate (AUGMENTIN) 875-125 MG tablet   Oral   Take 1 tablet by mouth 2 (two) times daily.   20 tablet   0   . atorvastatin (LIPITOR) 40 MG tablet   Oral   Take 1 tablet (40 mg total) by mouth daily.   90 tablet   3   . diazepam (VALIUM) 2 MG tablet   Oral   Take 1 tablet (2 mg total) by mouth every 8 (eight) hours as needed for muscle spasms.   9 tablet   0   . docusate sodium (COLACE) 100 MG capsule   Oral   Take 1 capsule (100 mg total) by mouth 3 (three) times daily as needed.   20 capsule   0   . etodolac (LODINE) 400 MG tablet   Oral   Take 1 tablet (400 mg total) by mouth 2 (two) times daily.   20 tablet   0   . ferrous sulfate 325 (65 FE)  MG tablet   Oral   Take 1 tablet (325 mg total) by mouth daily with breakfast.   90 tablet   3   . HYDROcodone-acetaminophen (NORCO) 5-325 MG tablet   Oral   Take 1 tablet by mouth every 6 (six) hours as needed for moderate pain.   120 tablet   0     May fill on 11-08-15   . levothyroxine (SYNTHROID) 200 MCG tablet   Oral   Take 1 tablet (200 mcg total) by mouth daily before breakfast.   90 tablet   3   . Multiple Vitamins-Minerals (MULTIVITAMIN WITH MINERALS) tablet   Oral   Take 1 tablet by mouth daily.         Marland Kitchen omeprazole (PRILOSEC) 40 MG capsule   Oral   Take 1 capsule (40 mg total) by mouth every evening.   90 capsule   3   . phentermine (ADIPEX-P) 37.5 MG tablet   Oral   Take 1 tablet (37.5 mg total) by mouth daily before breakfast.   90 tablet   1     Allergies Review of patient's allergies indicates no known allergies.  Family History  Problem Relation Age of Onset  .  Hypertension Other   . Cancer Other     ovarian,uterine,breast  . Stroke Other     female 1st degree relative <50  . Cancer Mother     breast  . Colon polyps Mother   . Cancer Brother     brain  . Brain cancer Brother   . Bladder Cancer Father   . Leukemia Father   . Colon polyps Father     Social History Social History  Substance Use Topics  . Smoking status: Former Smoker    Quit date: 09/08/1988  . Smokeless tobacco: Never Used  . Alcohol Use: No    Review of Systems Constitutional: No fever/chills Cardiovascular: Denies chest pain. Respiratory: Denies shortness of breath. Gastrointestinal: No abdominal pain.  No nausea, no vomiting.  No diarrhea.  No constipation. Genitourinary: Negative for dysuria. Musculoskeletal: Positive for low back pain Skin: Negative for rash. Neurological: Negative for headaches, focal weakness. positive for left leg paresthesias.  10-point ROS otherwise negative.  ____________________________________________   PHYSICAL EXAM:  VITAL SIGNS: ED Triage Vitals  Enc Vitals Group     BP 09/19/15 0826 128/83 mmHg     Pulse Rate 09/19/15 0826 82     Resp 09/19/15 0826 18     Temp 09/19/15 0826 98 F (36.7 C)     Temp Source 09/19/15 0826 Oral     SpO2 09/19/15 0826 97 %     Weight 09/19/15 0826 260 lb (117.935 kg)     Height 09/19/15 0826 5\' 5"  (1.651 m)     Head Cir --      Peak Flow --      Pain Score 09/19/15 0824 9     Pain Loc --      Pain Edu? --      Excl. in [place]? --     Constitutional: Alert and oriented. Well appearing and in no acute distress. Patient sitting in wheelchair with difficulty getting to the stretcher with assistance. Moderately obese. Eyes: Conjunctivae are normal. PERRL. EOMI. Head: Atraumatic. Nose: No congestion/rhinnorhea. Neck: No stridor.   Cardiovascular: Normal rate, regular rhythm. Grossly normal heart sounds.  Good peripheral circulation. Respiratory: Normal respiratory effort.  No retractions.  Lungs CTAB. Gastrointestinal: Soft and nontender. No distention.  No CVA tenderness. Musculoskeletal:  Examination of lower back fields show any gross abnormality. There is marked tenderness on palpation of the L5-S1 and sacral area with paravertebral muscle tenderness. The left paravertebral muscle region is more tender than on the right. Straight leg raises were deferred secondary to patient's pain. Motor sensory function intact.  Neurologic:  Normal speech and language. No gross focal neurologic deficits are appreciated. Reflexes 1+ bilaterally. Skin:  Skin is warm, dry and intact. No rash noted. Psychiatric: Mood and affect are normal. Speech and behavior are normal.  ____________________________________________   LABS (all labs ordered are listed, but only abnormal results are displayed)  Labs Reviewed  POCT PREGNANCY, URINE     RADIOLOGY  Lumbar spine x-ray per radiologist shows degenerative changes with no acute abnormality. ____________________________________________   PROCEDURES  Procedure(s) performed: None  Critical Care performed: No  ____________________________________________   INITIAL IMPRESSION / ASSESSMENT AND PLAN / ED COURSE  Pertinent labs & imaging results that were available during my care of the patient were reviewed by me and considered in my medical decision making (see chart for details).  Patient is continue taking her Norco that her physician in [place] has started prescribed. She is given a prescription for diazepam 2 mg 1 every 8 hours as needed for muscle spasms for 3 days and etodolac 400 mg twice a day for inflammation. She is encouraged to use ice or heat to her back and follow-up with her doctor if any continued problems. ____________________________________________   FINAL CLINICAL IMPRESSION(S) / ED DIAGNOSES  Final diagnoses:  Low back sprain, initial encounter      Johnn Hai, PA-C 09/19/15 1306  Daymon Larsen,  MD 09/19/15 [phone]

## 2015-09-20 ENCOUNTER — Ambulatory Visit (INDEPENDENT_AMBULATORY_CARE_PROVIDER_SITE_OTHER): Payer: BC Managed Care – PPO | Admitting: Family Medicine

## 2015-09-20 ENCOUNTER — Telehealth: Payer: Self-pay | Admitting: Family Medicine

## 2015-09-20 ENCOUNTER — Encounter: Payer: Self-pay | Admitting: Family Medicine

## 2015-09-20 VITALS — BP 140/98 | HR 90 | Temp 98.2°F

## 2015-09-20 DIAGNOSIS — M545 Low back pain, unspecified: Secondary | ICD-10-CM

## 2015-09-20 MED ORDER — METHOCARBAMOL 500 MG PO TABS: 500.0000 mg | ORAL_TABLET | Freq: Four times a day (QID) | ORAL | Status: DC | PRN

## 2015-09-20 MED ORDER — OXYCODONE-ACETAMINOPHEN 10-325 MG PO TABS: 1.0000 | ORAL_TABLET | Freq: Four times a day (QID) | ORAL | Status: DC | PRN

## 2015-09-20 MED ORDER — PREDNISONE 10 MG PO TABS: ORAL_TABLET | ORAL | Status: DC

## 2015-09-20 NOTE — Progress Notes (Signed)
   Subjective:    Patient ID: Marie Tran, female    DOB: 19-Dec-1970, 45 y.o.   MRN: [id number]  HPI Here to follow up an ER visit yesterday for severe low back pain. She has dealt with this off and on for years. 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 This radiates around the left hip area but does not run down the leg. No weakness or numbness in the leg. She was using her usual Vicodin at home with heat with no relief. At the ER she had a plain Xray of the lumbar spine which was unremarkable. They told her to stay on the Vicodin and they gave her prescriptions for Valium and Etodolac, but she has had not filled these yet. Today the pain is still severe and she requires assistance to walk.    Review of Systems  Constitutional: Negative.   Musculoskeletal: Positive for back pain and gait problem.  Neurological: Negative.        Objective:   Physical Exam  Constitutional: She appears well-developed and well-nourished.  In pain   Musculoskeletal:  Very tender in the lower back, especially on the left side. Not tender over the sciatic notch. There is significant spasm of the left lower paraspinal muscles and ROM is quite reduced.           Assessment & Plan:  Low back pain, most likely from a herniated disc. Her last MRI scan was in December 2012 and this showed a left sided herniated disc at L3-4 which was not putting pressure on the nerves. My guess is this disc has worsened and is in fact now putting pressure on the left L3-4 nerve root. We will set up another MRI next week. Instead of her current meds, she will use Percocet 10-325, Robaxin 500 mg, and a prednisone taper for pain relief. Written out of work today until 09-30-15.

## 2015-09-20 NOTE — Progress Notes (Signed)
Pre visit review using our clinic review tool, if applicable. No additional management support is needed unless otherwise documented below in the visit note. Pt unable to weigh 

## 2015-09-20 NOTE — Telephone Encounter (Signed)
Rph called re: prednisone rx. Was missing a day 3 and quantity was off by three. I spoke with dr Sarajane Jews and clarified. Advised Rph to add 3 a day for 3 days and change quantity to 63.

## 2015-09-26 ENCOUNTER — Ambulatory Visit
Admission: RE | Admit: 2015-09-26 | Discharge: 2015-09-26 | Disposition: A | Discharge status: Home / Self Care | Payer: BC Managed Care – PPO | Source: Ambulatory Visit | Attending: Family Medicine | Admitting: Family Medicine

## 2015-09-26 DIAGNOSIS — M545 Low back pain, unspecified: Secondary | ICD-10-CM

## 2015-09-27 NOTE — Addendum Note (Signed)
Addended by: [name] A on: 09/27/2015 01:19 PM   Modules accepted: Orders

## 2015-10-10 ENCOUNTER — Other Ambulatory Visit: Payer: Self-pay | Admitting: Family Medicine

## 2015-11-05 ENCOUNTER — Ambulatory Visit (INDEPENDENT_AMBULATORY_CARE_PROVIDER_SITE_OTHER): Payer: BC Managed Care – PPO | Admitting: Family Medicine

## 2015-11-05 ENCOUNTER — Encounter: Payer: Self-pay | Admitting: Family Medicine

## 2015-11-05 VITALS — BP 116/95 | HR 112 | Temp 99.7°F | Wt 268.3 lb

## 2015-11-05 DIAGNOSIS — B349 Viral infection, unspecified: Secondary | ICD-10-CM | POA: Diagnosis not present

## 2015-11-05 MED ORDER — ONDANSETRON HCL 8 MG PO TABS: 8.0000 mg | ORAL_TABLET | Freq: Three times a day (TID) | ORAL | Status: DC | PRN

## 2015-11-05 MED ORDER — HYDROCODONE-HOMATROPINE 5-1.5 MG/5ML PO SYRP: 5.0000 mL | ORAL_SOLUTION | ORAL | Status: DC | PRN

## 2015-11-05 NOTE — Progress Notes (Signed)
Pre visit review using our clinic review tool, if applicable. No additional management support is needed unless otherwise documented below in the visit note. 

## 2015-11-05 NOTE — Progress Notes (Signed)
   Subjective:    Patient ID: Marie Tran, female    DOB: 1970-11-24, 45 y.o.   MRN: [id number]  HPI Here for 3 days of fever, body aches, headaches, ST, nausea without vomiting, and a dry cough.    Review of Systems  Constitutional: Positive for fever.  HENT: Positive for congestion, postnasal drip and sore throat. Negative for ear pain and sinus pressure.   Eyes: Negative.   Respiratory: Positive for cough.        Objective:   Physical Exam  Constitutional: She appears well-developed and well-nourished.  HENT:  Right Ear: External ear normal.  Left Ear: External ear normal.  Nose: Nose normal.  Mouth/Throat: Oropharynx is clear and moist.  Eyes: Conjunctivae are normal.  Neck: No thyromegaly present.  Pulmonary/Chest: Effort normal and breath sounds normal.  Lymphadenopathy:    She has no cervical adenopathy.          Assessment & Plan:  Viral illness. Rest , drink fluids, use Zofran for nausea, use Ibuprofen prn. Written out of work today and tomorrow

## 2015-11-11 ENCOUNTER — Telehealth: Payer: Self-pay | Admitting: Family Medicine

## 2015-11-11 MED ORDER — AZITHROMYCIN 250 MG PO TABS: ORAL_TABLET | ORAL | Status: DC

## 2015-11-11 NOTE — Telephone Encounter (Signed)
Patient Name: Marie Tran  DOB: June 08, 1971    Initial Comment Caller states was DX w/ Flu last Tuesday, has a terrible cough, when she takes a deep breath it causes her to cough and chest hurts    Nurse Assessment  Nurse: Leilani Merl, RN, Nira Conn Date/Time (Eastern Time): 11/11/2015 9:00:11 AM  Confirm and document reason for call. If symptomatic, describe symptoms. You must click the next button to save text entered. ---Caller states was DX w/ Flu last Tuesday, has a terrible cough, when she takes a deep breath it causes her to cough and chest hurts. It is only on the left side, she is also wheezing.  Has the patient traveled out of the country within the last 30 days? ---Not Applicable  Does the patient have any new or worsening symptoms? ---Yes  Will a triage be completed? ---Yes  Related visit to physician within the last 2 weeks? ---Yes  Does the PT have any chronic conditions? (i.e. diabetes, asthma, etc.) ---No  Is the patient pregnant or possibly pregnant? (Ask all females between the ages of 21-55) ---No  Is this a behavioral health or substance abuse call? ---No     Guidelines    Guideline Title Affirmed Question Affirmed Notes  Influenza Follow-up Call Patient sounds very sick or weak to the triager    Final Disposition User   Go to ED Now (or PCP triage) [place], RN, [name] states that she has to be at work at 1:45 pm today, she does not want to miss work. There are no appts within that time frame. Caller wants to wait until tomorrow to be seen. Caller informed that is not advised. Called office back line and information given.   Referrals  GO TO FACILITY REFUSED   Disagree/Comply: Disagree  Disagree/Comply Reason: Disagree with instructions

## 2015-11-11 NOTE — Telephone Encounter (Signed)
She may have a secondary bronchitis now. Call in a [place] for her

## 2015-11-11 NOTE — Telephone Encounter (Signed)
I sent script e-scribe and spoke with pt. 

## 2015-12-02 ENCOUNTER — Telehealth: Payer: Self-pay | Admitting: Family Medicine

## 2015-12-02 NOTE — Telephone Encounter (Signed)
Pt request refill  HYDROcodone-acetaminophen (NORCO) 5-325 MG tablet  Pt would like to increase the mg of this med and states she had discussed previously with Dr Sarajane Jews about doing this.

## 2015-12-03 MED ORDER — HYDROCODONE-ACETAMINOPHEN 10-325 MG PO TABS: 1.0000 | ORAL_TABLET | Freq: Four times a day (QID) | ORAL | Status: DC | PRN

## 2015-12-03 NOTE — Telephone Encounter (Signed)
done

## 2015-12-03 NOTE — Telephone Encounter (Signed)
Rx is ready for pick up and pt is aware  

## 2015-12-17 ENCOUNTER — Encounter: Payer: Self-pay | Admitting: Family Medicine

## 2015-12-17 ENCOUNTER — Ambulatory Visit (INDEPENDENT_AMBULATORY_CARE_PROVIDER_SITE_OTHER): Payer: BC Managed Care – PPO | Admitting: Family Medicine

## 2015-12-17 VITALS — BP 113/92 | HR 104 | Temp 98.4°F | Ht 65.0 in | Wt 266.0 lb

## 2015-12-17 DIAGNOSIS — J209 Acute bronchitis, unspecified: Secondary | ICD-10-CM

## 2015-12-17 MED ORDER — AMOXICILLIN-POT CLAVULANATE 875-125 MG PO TABS: 1.0000 | ORAL_TABLET | Freq: Two times a day (BID) | ORAL | Status: DC

## 2015-12-17 MED ORDER — HYDROCODONE-HOMATROPINE 5-1.5 MG/5ML PO SYRP: 5.0000 mL | ORAL_SOLUTION | ORAL | Status: DC | PRN

## 2015-12-17 NOTE — Progress Notes (Signed)
   Subjective:    Patient ID: Marie Tran, female    DOB: [birth year], 45 y.o.   MRN: [id number]  HPI Here for one week of chest congestion and coughing up green sputum. No fever. She was given a Zpack on 11-11-15 for similar symptoms and they went away for awhile.    Review of Systems  Constitutional: Negative.   HENT: Positive for congestion. Negative for postnasal drip, sinus pressure and sore throat.   Eyes: Negative.   Respiratory: Positive for cough and chest tightness. Negative for shortness of breath and wheezing.        Objective:   Physical Exam  Constitutional: She appears well-developed and well-nourished.  HENT:  Right Ear: External ear normal.  Left Ear: External ear normal.  Nose: Nose normal.  Mouth/Throat: Oropharynx is clear and moist.  Eyes: Conjunctivae are normal.  Neck: No thyromegaly present.  Pulmonary/Chest: Effort normal. No respiratory distress. She has no wheezes. She has no rales.  Scattered rhonchi   Lymphadenopathy:    She has no cervical adenopathy.          Assessment & Plan:  Bronchitis, treat with Augmentin.  Laurey Morale, MD

## 2015-12-17 NOTE — Progress Notes (Signed)
Pre visit review using our clinic review tool, if applicable. No additional management support is needed unless otherwise documented below in the visit note. 

## 2015-12-30 ENCOUNTER — Telehealth: Payer: Self-pay | Admitting: Family Medicine

## 2015-12-30 MED ORDER — HYDROCODONE-ACETAMINOPHEN 10-325 MG PO TABS: 1.0000 | ORAL_TABLET | Freq: Four times a day (QID) | ORAL | Status: DC | PRN

## 2015-12-30 NOTE — Telephone Encounter (Signed)
Pt need new Rx for Hydrocodone °

## 2015-12-30 NOTE — Telephone Encounter (Signed)
done

## 2015-12-31 NOTE — Telephone Encounter (Signed)
Script is ready for pick up and I left a voice message for pt. 

## 2016-01-27 ENCOUNTER — Telehealth: Payer: Self-pay | Admitting: Family Medicine

## 2016-01-27 NOTE — Telephone Encounter (Signed)
Pt request refill  °HYDROcodone-acetaminophen (NORCO) 10-325 MG tablet °

## 2016-01-27 NOTE — Telephone Encounter (Signed)
Ok to refill 

## 2016-01-29 MED ORDER — HYDROCODONE-ACETAMINOPHEN 10-325 MG PO TABS: 1.0000 | ORAL_TABLET | Freq: Four times a day (QID) | ORAL | Status: DC | PRN

## 2016-01-29 NOTE — Telephone Encounter (Signed)
Patient notified Rx is ready for pick-up. 

## 2016-01-29 NOTE — Telephone Encounter (Signed)
done

## 2016-02-26 ENCOUNTER — Telehealth: Payer: Self-pay | Admitting: Family Medicine

## 2016-02-26 NOTE — Telephone Encounter (Signed)
Pt needs new rx hydrocodone. Pt is aware md out of office until Monday 7/17

## 2016-03-02 MED ORDER — HYDROCODONE-ACETAMINOPHEN 10-325 MG PO TABS: 1.0000 | ORAL_TABLET | Freq: Four times a day (QID) | ORAL | Status: DC | PRN

## 2016-03-02 NOTE — Telephone Encounter (Signed)
done

## 2016-03-02 NOTE — Telephone Encounter (Signed)
Script is ready for pick up and I spoke with pt.  

## 2016-03-30 ENCOUNTER — Telehealth: Payer: Self-pay | Admitting: Family Medicine

## 2016-03-30 MED ORDER — HYDROCODONE-ACETAMINOPHEN 10-325 MG PO TABS: 1.0000 | ORAL_TABLET | Freq: Four times a day (QID) | ORAL | 0 refills | Status: DC | PRN

## 2016-03-30 NOTE — Telephone Encounter (Signed)
Pt needs new rx hydrocodone °

## 2016-03-30 NOTE — Telephone Encounter (Signed)
done

## 2016-03-31 NOTE — Telephone Encounter (Signed)
Patient informed that Rx is up front & ready for pick up.

## 2016-04-21 ENCOUNTER — Telehealth: Payer: Self-pay | Admitting: Family Medicine

## 2016-04-21 ENCOUNTER — Encounter: Payer: Self-pay | Admitting: Family Medicine

## 2016-04-21 ENCOUNTER — Ambulatory Visit (INDEPENDENT_AMBULATORY_CARE_PROVIDER_SITE_OTHER): Payer: BC Managed Care – PPO | Admitting: Family Medicine

## 2016-04-21 VITALS — BP 130/80 | Temp 98.4°F | Ht 65.0 in | Wt 263.0 lb

## 2016-04-21 DIAGNOSIS — G43909 Migraine, unspecified, not intractable, without status migrainosus: Secondary | ICD-10-CM

## 2016-04-21 MED ORDER — HYDROCODONE-ACETAMINOPHEN 10-325 MG PO TABS: 1.0000 | ORAL_TABLET | Freq: Four times a day (QID) | ORAL | 0 refills | Status: DC | PRN

## 2016-04-21 MED ORDER — SUMATRIPTAN SUCCINATE 100 MG PO TABS: 100.0000 mg | ORAL_TABLET | ORAL | 5 refills | Status: DC | PRN

## 2016-04-21 MED ORDER — DICLOFENAC SODIUM 75 MG PO TBEC: 75.0000 mg | DELAYED_RELEASE_TABLET | Freq: Two times a day (BID) | ORAL | 2 refills | Status: DC

## 2016-04-21 MED ORDER — METHOCARBAMOL 500 MG PO TABS: 500.0000 mg | ORAL_TABLET | Freq: Four times a day (QID) | ORAL | 2 refills | Status: DC | PRN

## 2016-04-21 NOTE — Telephone Encounter (Signed)
Refill request for Phentermine 37.5 mg and a 90 day supply to [facility].

## 2016-04-21 NOTE — Telephone Encounter (Signed)
This is daily. Call in #90 with one rf

## 2016-04-21 NOTE — Progress Notes (Signed)
Pre visit review using our clinic review tool, if applicable. No additional management support is needed unless otherwise documented below in the visit note. 

## 2016-04-21 NOTE — Progress Notes (Signed)
   Subjective:    Patient ID: Marie Tran, female    DOB: Nov 15, 1970, 45 y.o.   MRN: [id number]  HPI Here for 2 days of a severe right sided headache that reminds her of the migraines she used to have. She has not had much trouble with migraines for the past 8 or 9 years. She does have constant neck pain however and this often radiates up the back of her head. She takes Norco for this. For the past 2 days she has also had nausea without vomiting and she is very sensitive to light.    Review of Systems  Constitutional: Negative.   Eyes: Positive for photophobia. Negative for visual disturbance.  Respiratory: Negative.   Cardiovascular: Negative.   Neurological: Positive for headaches.       Objective:   Physical Exam  Constitutional: She is oriented to person, place, and time.  In pain, photophobic   Neck: No thyromegaly present.  She is tender over the posterior neck with full ROM   Cardiovascular: Normal rate, regular rhythm, normal heart sounds and intact distal pulses.   Pulmonary/Chest: Effort normal and breath sounds normal.  Lymphadenopathy:    She has no cervical adenopathy.  Neurological: She is alert and oriented to person, place, and time. No cranial nerve deficit. She exhibits normal muscle tone. Coordination normal.          Assessment & Plan:  Mixed headache with an underlying tension headache now complicated by a migraine. Use Sumatriptan, Diclofenac, and Robaxin prn. Written out of work today and tomorrow.  Laurey Morale, MD

## 2016-04-23 MED ORDER — PHENTERMINE HCL 37.5 MG PO TABS: 37.5000 mg | ORAL_TABLET | Freq: Every day | ORAL | 1 refills | Status: DC

## 2016-04-23 NOTE — Telephone Encounter (Signed)
I called in script to below pharmacy.  

## 2016-05-18 ENCOUNTER — Telehealth: Payer: Self-pay | Admitting: Family Medicine

## 2016-05-18 MED ORDER — HYDROCODONE-ACETAMINOPHEN 10-325 MG PO TABS: 1.0000 | ORAL_TABLET | Freq: Four times a day (QID) | ORAL | 0 refills | Status: DC | PRN

## 2016-05-18 NOTE — Telephone Encounter (Signed)
Done for one month only. We will need to write for one month at a time now

## 2016-05-18 NOTE — Telephone Encounter (Signed)
° ° °  Pt request refill of the following: ° ° °HYDROcodone-acetaminophen (NORCO) 10-325 MG tablet ° ° °Phamacy: °

## 2016-05-19 NOTE — Telephone Encounter (Signed)
Script is ready for pick up here at front office, tried to reach pt and no answer.  

## 2016-06-12 ENCOUNTER — Other Ambulatory Visit: Payer: Self-pay | Admitting: Family Medicine

## 2016-06-16 ENCOUNTER — Telehealth: Payer: Self-pay | Admitting: Family Medicine

## 2016-06-16 NOTE — Telephone Encounter (Signed)
° ° °  Pt request refill of the following: ° ° °HYDROcodone-acetaminophen (NORCO) 10-325 MG tablet ° ° °Phamacy: °

## 2016-06-17 MED ORDER — HYDROCODONE-ACETAMINOPHEN 10-325 MG PO TABS: 1.0000 | ORAL_TABLET | Freq: Four times a day (QID) | ORAL | 0 refills | Status: DC | PRN

## 2016-06-17 NOTE — Telephone Encounter (Signed)
done

## 2016-06-18 NOTE — Telephone Encounter (Signed)
Script is ready for pick up here at front office and tried to reach pt, no answer.  

## 2016-07-16 ENCOUNTER — Telehealth: Payer: Self-pay | Admitting: Family Medicine

## 2016-07-16 MED ORDER — HYDROCODONE-ACETAMINOPHEN 10-325 MG PO TABS: 1.0000 | ORAL_TABLET | Freq: Four times a day (QID) | ORAL | 0 refills | Status: DC | PRN

## 2016-07-16 NOTE — Telephone Encounter (Signed)
Pt needs new rx hydrocodone °

## 2016-07-16 NOTE — Telephone Encounter (Signed)
done

## 2016-07-16 NOTE — Telephone Encounter (Signed)
Script is ready for pick up here at front office and I spoke with pt.  

## 2016-08-18 ENCOUNTER — Telehealth: Payer: Self-pay | Admitting: Family Medicine

## 2016-08-18 MED ORDER — HYDROCODONE-ACETAMINOPHEN 10-325 MG PO TABS: 1.0000 | ORAL_TABLET | Freq: Four times a day (QID) | ORAL | 0 refills | Status: DC | PRN

## 2016-08-18 NOTE — Telephone Encounter (Signed)
done

## 2016-08-18 NOTE — Telephone Encounter (Signed)
Pt need new Hydrocodone  Pt is aware of the 3 business days.

## 2016-08-19 ENCOUNTER — Encounter: Payer: Self-pay | Admitting: Family Medicine

## 2016-08-19 ENCOUNTER — Ambulatory Visit (INDEPENDENT_AMBULATORY_CARE_PROVIDER_SITE_OTHER): Payer: BC Managed Care – PPO | Admitting: Family Medicine

## 2016-08-19 VITALS — BP 150/100 | HR 73 | Temp 98.5°F | Ht 65.0 in | Wt 265.0 lb

## 2016-08-19 DIAGNOSIS — L309 Dermatitis, unspecified: Secondary | ICD-10-CM | POA: Diagnosis not present

## 2016-08-19 DIAGNOSIS — J019 Acute sinusitis, unspecified: Secondary | ICD-10-CM | POA: Diagnosis not present

## 2016-08-19 MED ORDER — AMOXICILLIN-POT CLAVULANATE 875-125 MG PO TABS: 1.0000 | ORAL_TABLET | Freq: Two times a day (BID) | ORAL | 0 refills | Status: DC

## 2016-08-19 MED ORDER — TRIAMCINOLONE ACETONIDE 0.1 % EX CREA: 1.0000 "application " | TOPICAL_CREAM | Freq: Three times a day (TID) | CUTANEOUS | 2 refills | Status: DC

## 2016-08-19 NOTE — Telephone Encounter (Signed)
Script is ready for pick up here at front office and I spoke with pt.  

## 2016-08-19 NOTE — Progress Notes (Signed)
   Subjective:    Patient ID: Marie Tran, female    DOB: 1971-03-12, 46 y.o.   MRN: [id number]  HPI Here for 2 reasons. First one month ago she developed itchy rashes around the neck and in both elbows. OTC cortisone creams do not help. Also 5 days ago she developed a fever, ST, body aches, dry cough, and sinus congestion. Now she is blowing green mucus from the nose. Using Theraflu.    Review of Systems  Constitutional: Positive for fever.  HENT: Positive for congestion, postnasal drip, sinus pain, sinus pressure and sore throat. Negative for ear pain.   Eyes: Negative.   Respiratory: Positive for cough.   Skin: Positive for rash.       Objective:   Physical Exam  Constitutional: She appears well-developed and well-nourished.  HENT:  Right Ear: External ear normal.  Left Ear: External ear normal.  Nose: Nose normal.  Mouth/Throat: Oropharynx is clear and moist.  Eyes: Conjunctivae are normal.  Neck: No thyromegaly present.  Pulmonary/Chest: Effort normal and breath sounds normal.  Lymphadenopathy:    She has no cervical adenopathy.  Skin:  The anterior neck and both antecubital areas have macular red rashes           Assessment & Plan:  She has a sinusitis, treat with Augmentin. Treat the eczema with Triamcinolone cream.  [name], MD

## 2016-08-19 NOTE — Progress Notes (Signed)
Pre visit review using our clinic review tool, if applicable. No additional management support is needed unless otherwise documented below in the visit note. 

## 2016-09-16 ENCOUNTER — Telehealth: Payer: Self-pay | Admitting: Family Medicine

## 2016-09-16 MED ORDER — HYDROCODONE-ACETAMINOPHEN 10-325 MG PO TABS: 1.0000 | ORAL_TABLET | Freq: Four times a day (QID) | ORAL | 0 refills | Status: DC | PRN

## 2016-09-16 NOTE — Telephone Encounter (Signed)
Pt would like new rx hydrocodone °

## 2016-09-16 NOTE — Telephone Encounter (Signed)
Script is ready for pick up here at front office, tried to reach pt and no answer.  

## 2016-09-16 NOTE — Telephone Encounter (Signed)
done

## 2016-09-25 ENCOUNTER — Other Ambulatory Visit: Payer: Self-pay | Admitting: Family Medicine

## 2016-10-16 ENCOUNTER — Telehealth: Payer: Self-pay | Admitting: Family Medicine

## 2016-10-16 MED ORDER — HYDROCODONE-ACETAMINOPHEN 10-325 MG PO TABS: 1.0000 | ORAL_TABLET | Freq: Four times a day (QID) | ORAL | 0 refills | Status: DC | PRN

## 2016-10-16 NOTE — Telephone Encounter (Signed)
Pt need new Rx for Hydrocodone   Pt is aware of 3 business days for refills. °

## 2016-10-16 NOTE — Telephone Encounter (Signed)
done

## 2016-10-19 NOTE — Telephone Encounter (Signed)
Script is ready for pick up here at front office and I spoke with pt.  

## 2016-11-02 ENCOUNTER — Ambulatory Visit (INDEPENDENT_AMBULATORY_CARE_PROVIDER_SITE_OTHER): Payer: BC Managed Care – PPO | Admitting: Family Medicine

## 2016-11-02 ENCOUNTER — Encounter: Payer: Self-pay | Admitting: Family Medicine

## 2016-11-02 VITALS — BP 150/98 | HR 73 | Temp 98.5°F | Ht 65.0 in | Wt 266.0 lb

## 2016-11-02 DIAGNOSIS — G4730 Sleep apnea, unspecified: Secondary | ICD-10-CM | POA: Diagnosis not present

## 2016-11-02 DIAGNOSIS — K219 Gastro-esophageal reflux disease without esophagitis: Secondary | ICD-10-CM

## 2016-11-02 DIAGNOSIS — R1310 Dysphagia, unspecified: Secondary | ICD-10-CM

## 2016-11-02 DIAGNOSIS — G5603 Carpal tunnel syndrome, bilateral upper limbs: Secondary | ICD-10-CM

## 2016-11-02 MED ORDER — OMEPRAZOLE 40 MG PO CPDR: 40.0000 mg | DELAYED_RELEASE_CAPSULE | Freq: Every evening | ORAL | 3 refills | Status: DC

## 2016-11-02 MED ORDER — LEVOTHYROXINE SODIUM 200 MCG PO TABS: 200.0000 ug | ORAL_TABLET | Freq: Every day | ORAL | 0 refills | Status: DC

## 2016-11-02 MED ORDER — PHENTERMINE HCL 37.5 MG PO TABS: 37.5000 mg | ORAL_TABLET | Freq: Every day | ORAL | 1 refills | Status: DC

## 2016-11-02 MED ORDER — ATORVASTATIN CALCIUM 40 MG PO TABS: 40.0000 mg | ORAL_TABLET | Freq: Every day | ORAL | 0 refills | Status: DC

## 2016-11-02 NOTE — Patient Instructions (Signed)
WE NOW OFFER   [facility] FAST TRACK!!!  SAME DAY Appointments for ACUTE CARE  Such as: Sprains, Injuries, cuts, abrasions, rashes, muscle pain, joint pain, back pain Colds, flu, sore throats, headache, allergies, cough, fever  Ear pain, sinus and eye infections Abdominal pain, nausea, vomiting, diarrhea, upset stomach Animal/insect bites  3 Easy Ways to Schedule: Walk-In Scheduling Call in scheduling Mychart Sign-up: [email or web address]         

## 2016-11-02 NOTE — Progress Notes (Signed)
   Subjective:    Patient ID: Marie Tran, female    DOB: [birth year], 46 y.o.   MRN: [id number]  HPI Here for a number of issues. First she admits to gaining a lot of weight on the past year, and this has added to some of her problems. She says she snores a lot now and her husband tells her that she often stops breathing at night. Also she has frequent numbness and pain in the hands, especially in the right hand. She does wear a wrist splint on the right during the day but this does not help. 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 Also she describes occasional trouble swallowing when eating solid foods for the past few months. No trouble swallowing liquids. No heartburn. She used to take Prilosec but she stopped.    Review of Systems  Constitutional: Negative.   HENT: Positive for trouble swallowing. Negative for sore throat and voice change.   Respiratory: Negative.   Cardiovascular: Negative.   Neurological: Positive for numbness.       Objective:   Physical Exam  Constitutional: She is oriented to person, place, and time.  Obese   Neck: Neck supple. No tracheal deviation present. No thyromegaly present.  Cardiovascular: Normal rate, regular rhythm, normal heart sounds and intact distal pulses.   Pulmonary/Chest: Effort normal and breath sounds normal. No stridor. No respiratory distress. She has no wheezes. She has no rales.  Musculoskeletal: She exhibits no edema.  Lymphadenopathy:    She has no cervical adenopathy.  Neurological: She is alert and oriented to person, place, and time.          Assessment & Plan:  She probably has sleep apnea and this is directly related to her weight gain. We agreed that she will start a diet and exercise program. She has some dysphagia which may be related to silent reflux. She will resume taking Prilosec every day. She probably has carpal tunnel syndrome. We will set up NCS soon.  [name],  MD

## 2016-11-02 NOTE — Progress Notes (Signed)
Pre visit review using our clinic review tool, if applicable. No additional management support is needed unless otherwise documented below in the visit note. 

## 2016-11-10 ENCOUNTER — Other Ambulatory Visit: Payer: Self-pay | Admitting: Family Medicine

## 2016-11-10 ENCOUNTER — Ambulatory Visit (INDEPENDENT_AMBULATORY_CARE_PROVIDER_SITE_OTHER): Payer: BC Managed Care – PPO | Admitting: Neurology

## 2016-11-10 DIAGNOSIS — G5603 Carpal tunnel syndrome, bilateral upper limbs: Secondary | ICD-10-CM | POA: Diagnosis not present

## 2016-11-10 DIAGNOSIS — Z1231 Encounter for screening mammogram for malignant neoplasm of breast: Secondary | ICD-10-CM

## 2016-11-10 NOTE — Procedures (Signed)
[facility]  [place], [address]  [place], [place] [address] Tel: [phone] Fax:  [phone] Test Date:  11/10/2016  Patient: Marie Tran DOB: 1970-12-31 Physician: Narda Amber, DO  Sex: Female Height: 5\' 5"  Ref Phys: [name], M.D.  ID#: [id number] Temp: 34.2C Technician:    Patient Complaints: This is a 46 year-old female referred for evaluation of bilateral hand numbness and tingling, worse on the right.  NCV & EMG Findings: Extensive electrodiagnostic testing of the right upper extremity and additional studies of the left shows:  1. Bilateral median sensory responses show prolonged latency (4.9, L4.0 ms) and normal amplitude. Bilateral ulnar sensory responses are within normal limits. 2. Bilateral median motor responses show prolonged latency (R4.4, L4.3 ms) and normal amplitude. Bilateral ulnar motor responses are within normal limits. 3. There is no evidence of active or chronic motor axon loss changes affecting any of the tested muscles. Motor unit configuration and recruitment pattern is within normal limits.  Impression: Bilateral median neuropathy at or distal to the wrist, consistent with the clinical diagnosis of carpal tunnel syndrome. Overall, these findings are moderate in degree electrically.   ___________________________ Narda Amber, DO    Nerve Conduction Studies Anti Sensory Summary Table   Site NR Peak (ms) Norm Peak (ms) P-T Amp (V) Norm P-T Amp  Left Median Anti Sensory (2nd Digit)  Wrist    4.0 <3.4 24.6 >20  Right Median Anti Sensory (2nd Digit)  Wrist    4.9 <3.4 22.4 >20  Left Ulnar Anti Sensory (5th Digit)  Wrist    2.6 <3.1 35.0 >12  Right Ulnar Anti Sensory (5th Digit)  Wrist    2.6 <3.1 38.5 >12   Motor Summary Table   Site NR Onset (ms) Norm Onset (ms) O-P Amp (mV) Norm O-P Amp Site1 Site2 Delta-0 (ms) Dist (cm) Vel (m/s) Norm Vel (m/s)  Left Median Motor (Abd Poll Brev)  Wrist    4.3 <3.9 9.2 >6 Elbow Wrist  5.0 26.5 53 >50  Elbow    9.3  9.1         Right Median Motor (Abd Poll Brev)  Wrist    4.4 <3.9 11.7 >6 Elbow Wrist 4.6 26.5 58 >50  Elbow    9.0  10.0         Left Ulnar Motor (Abd Dig Minimi)  Wrist    2.1 <3.1 10.5 >7 B Elbow Wrist 3.8 23.0 61 >50  B Elbow    5.9  10.3  A Elbow B Elbow 1.6 10.0 63 >50  A Elbow    7.5  10.0         Right Ulnar Motor (Abd Dig Minimi)  Wrist    2.8 <3.1 11.7 >7 B Elbow Wrist 3.8 23.0 61 >50  B Elbow    6.6  11.2  A Elbow B Elbow 1.8 10.0 56 >50  A Elbow    8.4  10.8          EMG   Side Muscle Ins Act Fibs Psw Fasc Number Recrt Dur Dur. Amp Amp. Poly Poly. Comment  Right 1stDorInt Nml Nml Nml Nml Nml Nml Nml Nml Nml Nml Nml Nml N/A  Right Abd Poll Brev Nml Nml Nml Nml Nml Nml Nml Nml Nml Nml Nml Nml N/A  Right PronatorTeres Nml Nml Nml Nml Nml Nml Nml Nml Nml Nml Nml Nml N/A  Right Biceps Nml Nml Nml Nml Nml Nml Nml Nml Nml Nml Nml Nml N/A  Right Triceps Nml Nml Nml Nml Nml Nml Nml Nml Nml Nml Nml Nml N/A  Right Deltoid Nml Nml Nml Nml Nml Nml Nml Nml Nml Nml Nml Nml N/A  Left 1stDorInt Nml Nml Nml Nml Nml Nml Nml Nml Nml Nml Nml Nml N/A  Left Abd Poll Brev Nml Nml Nml Nml Nml Nml Nml Nml Nml Nml Nml Nml N/A  Left PronatorTeres Nml Nml Nml Nml Nml Nml Nml Nml Nml Nml Nml Nml N/A  Left Biceps Nml Nml Nml Nml Nml Nml Nml Nml Nml Nml Nml Nml N/A      Waveforms:

## 2016-11-12 ENCOUNTER — Telehealth: Payer: Self-pay | Admitting: Family Medicine

## 2016-11-12 NOTE — Telephone Encounter (Signed)
Please result note page.

## 2016-11-12 NOTE — Telephone Encounter (Signed)
Pt would like to have results of ncs from dr patel office

## 2016-11-16 ENCOUNTER — Telehealth: Payer: Self-pay | Admitting: Family Medicine

## 2016-11-16 DIAGNOSIS — G5603 Carpal tunnel syndrome, bilateral upper limbs: Secondary | ICD-10-CM

## 2016-11-16 MED ORDER — HYDROCODONE-ACETAMINOPHEN 10-325 MG PO TABS: 1.0000 | ORAL_TABLET | Freq: Four times a day (QID) | ORAL | 0 refills | Status: DC | PRN

## 2016-11-16 NOTE — Telephone Encounter (Signed)
Pt is asking for 2 things.  She is asking for a refill on HYDROcodone-acetaminophen (NORCO) 10-325 MG tablet AND she is asking for a referral to a hand surgeon after having had her NCV/EMG.

## 2016-11-16 NOTE — Telephone Encounter (Signed)
Refill is ready and the referral is done

## 2016-11-17 NOTE — Telephone Encounter (Signed)
Pt called back and aware of message.

## 2016-11-17 NOTE — Addendum Note (Signed)
Addended by: [name] A on: 11/17/2016 01:03 PM   Modules accepted: Orders

## 2016-11-23 ENCOUNTER — Telehealth: Payer: Self-pay | Admitting: Neurology

## 2016-11-23 ENCOUNTER — Telehealth: Payer: Self-pay | Admitting: Family Medicine

## 2016-11-23 NOTE — Telephone Encounter (Signed)
PT called and has a question about her EMG and faxing the results to a surgeon that she is being referred to/Dawn

## 2016-11-23 NOTE — Telephone Encounter (Signed)
Pt calling to see if the NCV test result can be sent to [facility] at [place] so she can be seen  [phone] (F) this was given to [place].

## 2016-11-23 NOTE — Telephone Encounter (Signed)
Information sent as requested.

## 2016-11-24 NOTE — Telephone Encounter (Signed)
Patient requested for EMG results to be faxed to [place].

## 2016-11-24 NOTE — Telephone Encounter (Signed)
Called patient and left message for her to call me back.

## 2016-12-01 ENCOUNTER — Ambulatory Visit
Admission: RE | Admit: 2016-12-01 | Discharge: 2016-12-01 | Disposition: A | Discharge status: Home / Self Care | Payer: BC Managed Care – PPO | Source: Ambulatory Visit | Attending: Family Medicine | Admitting: Family Medicine

## 2016-12-01 DIAGNOSIS — Z1231 Encounter for screening mammogram for malignant neoplasm of breast: Secondary | ICD-10-CM

## 2016-12-15 ENCOUNTER — Telehealth: Payer: Self-pay | Admitting: Family Medicine

## 2016-12-15 MED ORDER — HYDROCODONE-ACETAMINOPHEN 10-325 MG PO TABS: 1.0000 | ORAL_TABLET | Freq: Four times a day (QID) | ORAL | 0 refills | Status: DC | PRN

## 2016-12-15 NOTE — Telephone Encounter (Signed)
Pt need new Rx for Hydrocodone   Pt is aware of 3 business days for refills and someone will call when ready for pick up. °

## 2016-12-15 NOTE — Telephone Encounter (Signed)
Script is ready for pick up here at front office and I left a voice message for pt.  

## 2016-12-15 NOTE — Telephone Encounter (Signed)
done

## 2017-01-04 ENCOUNTER — Telehealth: Payer: Self-pay | Admitting: Family Medicine

## 2017-01-04 NOTE — Telephone Encounter (Signed)
Pt calling stating that she is a little confused she thought that her Dx of carpel tunnel was going to be filed as a Architectural technologist with the Psychologist, sport and exercise office.  Pt would like to have a call back to see what needs to be done.

## 2017-01-06 NOTE — Telephone Encounter (Signed)
I have nothing to do with whether this is filed as Workers Comp or not. She needs to speak to the orthopedics office about this

## 2017-01-06 NOTE — Telephone Encounter (Signed)
Please have Marie Tran talk to her about this

## 2017-01-06 NOTE — Telephone Encounter (Signed)
I left a voice message for pt to return my call.  

## 2017-01-07 NOTE — Telephone Encounter (Signed)
I spoke with pt and she has already taken care of this matter, nothing needed further at this time.

## 2017-01-07 NOTE — Telephone Encounter (Signed)
Pt returning your call

## 2017-01-13 ENCOUNTER — Telehealth: Payer: Self-pay | Admitting: Family Medicine

## 2017-01-13 NOTE — Telephone Encounter (Signed)
Pt need new Rx for Hydrocodone   Pt is aware of 3 business days for refills and someone will call when ready for pick up. °

## 2017-01-14 MED ORDER — HYDROCODONE-ACETAMINOPHEN 10-325 MG PO TABS: 1.0000 | ORAL_TABLET | Freq: Four times a day (QID) | ORAL | 0 refills | Status: DC | PRN

## 2017-01-14 NOTE — Telephone Encounter (Signed)
done

## 2017-01-14 NOTE — Telephone Encounter (Signed)
Script is ready for pick up and I left a message for pt. 

## 2017-02-11 ENCOUNTER — Telehealth: Payer: Self-pay | Admitting: Family Medicine

## 2017-02-11 NOTE — Telephone Encounter (Signed)
° ° °  Pt request refill of the following:  HYDROcodone-acetaminophen (NORCO) 10-325 MG tablet  Pt is aware med is not due till 02/16/17    Phamacy:

## 2017-02-12 NOTE — Telephone Encounter (Signed)
Ok to refill with fill date of 7/3? Pt is aware medication is not due until 7/3

## 2017-02-15 MED ORDER — HYDROCODONE-ACETAMINOPHEN 10-325 MG PO TABS: 1.0000 | ORAL_TABLET | Freq: Four times a day (QID) | ORAL | 0 refills | Status: DC | PRN

## 2017-02-15 NOTE — Telephone Encounter (Signed)
done

## 2017-02-15 NOTE — Telephone Encounter (Signed)
° ° ° ° °  Pt request refill of the following:   Pt following up on her refill hrodrocodone    Phamacy:

## 2017-02-16 NOTE — Telephone Encounter (Signed)
Patient aware Rx is ready & up front.

## 2017-03-17 ENCOUNTER — Telehealth: Payer: Self-pay | Admitting: Family Medicine

## 2017-03-17 MED ORDER — HYDROCODONE-ACETAMINOPHEN 10-325 MG PO TABS: 1.0000 | ORAL_TABLET | Freq: Four times a day (QID) | ORAL | 0 refills | Status: DC | PRN

## 2017-03-17 NOTE — Telephone Encounter (Signed)
done

## 2017-03-17 NOTE — Telephone Encounter (Signed)
Pt request refill  °HYDROcodone-acetaminophen (NORCO) 10-325 MG tablet °

## 2017-03-18 NOTE — Telephone Encounter (Signed)
Script is ready for pick up here at front office and I left a voice message.

## 2017-04-15 ENCOUNTER — Telehealth: Payer: Self-pay | Admitting: Family Medicine

## 2017-04-15 MED ORDER — HYDROCODONE-ACETAMINOPHEN 10-325 MG PO TABS: 1.0000 | ORAL_TABLET | Freq: Four times a day (QID) | ORAL | 0 refills | Status: DC | PRN

## 2017-04-15 NOTE — Telephone Encounter (Signed)
done

## 2017-04-15 NOTE — Telephone Encounter (Signed)
Script ready for pick up here at front office and I spoke with pt.

## 2017-04-15 NOTE — Telephone Encounter (Signed)
Pt request refill  °HYDROcodone-acetaminophen (NORCO) 10-325 MG tablet °

## 2017-05-12 ENCOUNTER — Other Ambulatory Visit: Payer: Self-pay | Admitting: Family Medicine

## 2017-05-12 NOTE — Telephone Encounter (Signed)
Call in #90 with one rf 

## 2017-05-13 ENCOUNTER — Telehealth: Payer: Self-pay | Admitting: Family Medicine

## 2017-05-13 NOTE — Telephone Encounter (Signed)
Pt would like to see if Dr. Sarajane Jews would call her something in for bronchitis.  Unable to come in due her working late.  [facility] [address]

## 2017-05-14 MED ORDER — AMOXICILLIN-POT CLAVULANATE 875-125 MG PO TABS: 1.0000 | ORAL_TABLET | Freq: Two times a day (BID) | ORAL | 0 refills | Status: DC

## 2017-05-14 NOTE — Telephone Encounter (Signed)
[facility], tried to reach pt and no answer.

## 2017-05-14 NOTE — Telephone Encounter (Signed)
Patient called to check on status of message from yesterday and also to ask about her eyes being watery and puffy. Advised her to try OTC allergy tablets and eye drops for these symptoms. Also advised that message would be sent to Dr. Sarajane Jews for advice on her symptoms.

## 2017-05-14 NOTE — Telephone Encounter (Signed)
Call in Augmentin 875 bid for 10 days  

## 2017-05-17 ENCOUNTER — Telehealth: Payer: Self-pay | Admitting: *Deleted

## 2017-05-17 MED ORDER — HYDROCODONE-ACETAMINOPHEN 10-325 MG PO TABS: 1.0000 | ORAL_TABLET | Freq: Four times a day (QID) | ORAL | 0 refills | Status: DC | PRN

## 2017-05-17 NOTE — Telephone Encounter (Signed)
done

## 2017-05-17 NOTE — Telephone Encounter (Signed)
Patient called requesting a refill on hydrocodone. Please advise [phone]

## 2017-05-18 NOTE — Telephone Encounter (Signed)
Script is ready for pick up here at front office and spoke with pt, also letter was given.  

## 2017-06-07 ENCOUNTER — Other Ambulatory Visit: Payer: Self-pay | Admitting: Family Medicine

## 2017-06-22 ENCOUNTER — Encounter: Payer: Self-pay | Admitting: Family Medicine

## 2017-06-22 ENCOUNTER — Ambulatory Visit: Payer: BLUE CROSS/BLUE SHIELD | Admitting: Family Medicine

## 2017-06-22 VITALS — BP 130/80 | Temp 98.6°F | Ht 65.0 in | Wt 251.0 lb

## 2017-06-22 DIAGNOSIS — M5440 Lumbago with sciatica, unspecified side: Secondary | ICD-10-CM | POA: Diagnosis not present

## 2017-06-22 DIAGNOSIS — D509 Iron deficiency anemia, unspecified: Secondary | ICD-10-CM | POA: Diagnosis not present

## 2017-06-22 DIAGNOSIS — H6122 Impacted cerumen, left ear: Secondary | ICD-10-CM | POA: Diagnosis not present

## 2017-06-22 DIAGNOSIS — G8929 Other chronic pain: Secondary | ICD-10-CM | POA: Diagnosis not present

## 2017-06-22 MED ORDER — GABAPENTIN 100 MG PO CAPS: 100.0000 mg | ORAL_CAPSULE | Freq: Two times a day (BID) | ORAL | 5 refills | Status: DC

## 2017-06-22 MED ORDER — SUMATRIPTAN SUCCINATE 100 MG PO TABS: 100.0000 mg | ORAL_TABLET | ORAL | 5 refills | Status: DC | PRN

## 2017-06-22 MED ORDER — HYDROCODONE-ACETAMINOPHEN 10-325 MG PO TABS: 1.0000 | ORAL_TABLET | Freq: Four times a day (QID) | ORAL | 0 refills | Status: DC | PRN

## 2017-06-22 MED ORDER — FERROUS SULFATE 325 (65 FE) MG PO TABS: 325.0000 mg | ORAL_TABLET | Freq: Every day | ORAL | 3 refills | Status: DC

## 2017-06-22 NOTE — Progress Notes (Signed)
   Subjective:    Patient ID: Marie Tran, female    DOB: [birth year], 46 y.o.   MRN: [id number]  HPI Here for several things. First she still deals with chronic low back pain that often radiates through the right buttock to the right upper leg. She takes Norco as needed. 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 Also for the past month she has had a mild discomfort in the left ear with some popping and crackling. No change in hearing. No allergy or URI symptoms.    Review of Systems  Constitutional: Negative.   HENT: Positive for ear pain. Negative for congestion, sinus pressure, sinus pain and sore throat.   Eyes: Negative.   Respiratory: Negative.   Musculoskeletal: Positive for back pain.       Objective:   Physical Exam  Constitutional: She is oriented to person, place, and time. She appears well-developed and well-nourished.  HENT:  Left Ear: External ear normal.  Nose: Nose normal.  Mouth/Throat: Oropharynx is clear and moist.  Left ear canal is full of cerumen   Eyes: Conjunctivae are normal.  Neck: No thyromegaly present.  Pulmonary/Chest: Effort normal and breath sounds normal. No respiratory distress. She has no wheezes. She has no rales.  Lymphadenopathy:    She has no cervical adenopathy.  Neurological: She is alert and oriented to person, place, and time.          Assessment & Plan:  For the cerumen impaction, the canal was irrigated clear with water but this was not successful. We will refer her to ENT for this. For the low back pain ,she will start on Gabapentin 100 mg bid. We can titrate up from there if needed.  [name], MD

## 2017-06-22 NOTE — Patient Instructions (Signed)
WE NOW OFFER   [facility] FAST TRACK!!!  SAME DAY Appointments for ACUTE CARE  Such as: Sprains, Injuries, cuts, abrasions, rashes, muscle pain, joint pain, back pain Colds, flu, sore throats, headache, allergies, cough, fever  Ear pain, sinus and eye infections Abdominal pain, nausea, vomiting, diarrhea, upset stomach Animal/insect bites  3 Easy Ways to Schedule: Walk-In Scheduling Call in scheduling Mychart Sign-up: [email or web address]         

## 2017-07-20 ENCOUNTER — Other Ambulatory Visit: Payer: Self-pay | Admitting: Family Medicine

## 2017-07-20 NOTE — Telephone Encounter (Deleted)
Pt request refill  °HYDROcodone-acetaminophen (NORCO) 10-325 MG tablet °

## 2017-07-20 NOTE — Telephone Encounter (Signed)
Copied from [facility]. Topic: Quick Communication - Rx Refill/Question >> Jul 20, 2017  1:10 PM Scherrie Gerlach wrote: Pt request refill  HYDROcodone-acetaminophen (NORCO) 10-325 MG tablet

## 2017-07-21 ENCOUNTER — Telehealth: Payer: Self-pay

## 2017-07-21 NOTE — Telephone Encounter (Signed)
[facility] pt is requesting  New Rx for Fluconazole 150 MG tablet. Call pt what does she want this refilled for is she currently having yeast infection symptoms or UTI symptoms?

## 2017-07-22 MED ORDER — FLUCONAZOLE 150 MG PO TABS
150.0000 mg | ORAL_TABLET | Freq: Once | ORAL | 5 refills | Status: AC
Start: 2017-07-22 — End: 2017-07-22

## 2017-07-22 MED ORDER — HYDROCODONE-ACETAMINOPHEN 10-325 MG PO TABS: 1.0000 | ORAL_TABLET | Freq: Four times a day (QID) | ORAL | 0 refills | Status: DC | PRN

## 2017-07-22 NOTE — Telephone Encounter (Signed)
Medication filled to pharmacy as requested. Called patient and left message that med was sent.

## 2017-07-22 NOTE — Telephone Encounter (Signed)
Done but she will need a pmv soon

## 2017-07-22 NOTE — Telephone Encounter (Signed)
Call in Fluconazole 150 mg #1 with 5 rf

## 2017-07-22 NOTE — Telephone Encounter (Signed)
Sent to PCP ?

## 2017-07-22 NOTE — Telephone Encounter (Signed)
Hydrocodone refill.Last refill and OV 06/22/17.

## 2017-07-22 NOTE — Telephone Encounter (Signed)
Pt is here now to pick up script.  

## 2017-07-22 NOTE — Telephone Encounter (Signed)
Please advise 

## 2017-07-22 NOTE — Addendum Note (Signed)
Addended by: Dorrene German on: 07/22/2017 01:32 PM   Modules accepted: Orders

## 2017-07-22 NOTE — Telephone Encounter (Addendum)
Pt states she has a yeast infection. Pt states Dr Sarajane Jews is aware she gets yeast infection sometimes due to changes in soap and some personal products, and she did. Pt aware Dr Sarajane Jews is out this afternoon  [facility] [address]

## 2017-08-19 ENCOUNTER — Ambulatory Visit: Payer: BLUE CROSS/BLUE SHIELD | Admitting: Family Medicine

## 2017-08-23 ENCOUNTER — Ambulatory Visit (INDEPENDENT_AMBULATORY_CARE_PROVIDER_SITE_OTHER): Payer: BLUE CROSS/BLUE SHIELD | Admitting: Family Medicine

## 2017-08-23 ENCOUNTER — Encounter: Payer: Self-pay | Admitting: Family Medicine

## 2017-08-23 VITALS — BP 118/80 | HR 82 | Temp 98.3°F | Wt 255.2 lb

## 2017-08-23 DIAGNOSIS — F1190 Opioid use, unspecified, uncomplicated: Secondary | ICD-10-CM

## 2017-08-23 DIAGNOSIS — M5440 Lumbago with sciatica, unspecified side: Secondary | ICD-10-CM

## 2017-08-23 DIAGNOSIS — G8929 Other chronic pain: Secondary | ICD-10-CM | POA: Diagnosis not present

## 2017-08-23 MED ORDER — HYDROCODONE-ACETAMINOPHEN 10-325 MG PO TABS: 1.0000 | ORAL_TABLET | Freq: Four times a day (QID) | ORAL | 0 refills | Status: DC | PRN

## 2017-08-23 NOTE — Progress Notes (Signed)
   Subjective:    Patient ID: Marie Tran, female    DOB: 11-Nov-1970, 47 y.o.   MRN: [id number]  HPI Here for pain management. Her back pain has been stable but she has had more trouble with the pain lately. 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 She last saw the PA at Dr. Sallye Ober office a year ago.  Indication for chronic opioid: low back pain  Medication and dose: Norco 10-325  # pills per month: 120 Last UDS date: 08-23-17 Pain contract signed (Y/N): 08-23-17 Date narcotic database last reviewed (include red flags): 08-23-17     Review of Systems  Constitutional: Negative.   Respiratory: Negative.   Cardiovascular: Negative.   Musculoskeletal: Positive for back pain.       Objective:   Physical Exam  Constitutional: She is oriented to person, place, and time. She appears well-developed and well-nourished.  Cardiovascular: Normal rate, regular rhythm, normal heart sounds and intact distal pulses.  Pulmonary/Chest: Effort normal and breath sounds normal. No respiratory distress. She has no wheezes. She has no rales.  Neurological: She is alert and oriented to person, place, and time.          Assessment & Plan:  Chronic low back pain. Meds were refilled. Even though she is not a surgical candidate, I would think she could benefit for epidural steroid injections or some other modalities. I advised her to see Dr. Othelia Pulling again and discuss possible strategies.  [name], MD

## 2017-08-26 LAB — PAIN MGMT, PROFILE 8 W/CONF, U
6 Acetylmorphine: NEGATIVE ng/mL (ref <10)
ALCOHOL METABOLITES: NEGATIVE ng/mL (ref <500)
Amphetamines: NEGATIVE ng/mL (ref <500)
BENZODIAZEPINES: NEGATIVE ng/mL (ref <100)
Buprenorphine, Urine: NEGATIVE ng/mL (ref <5)
CREATININE: 122.9 mg/dL
Cocaine Metabolite: NEGATIVE ng/mL (ref <150)
Codeine: NEGATIVE ng/mL (ref <50)
HYDROCODONE: 498 ng/mL — AB (ref <50)
HYDROMORPHONE: 104 ng/mL — AB (ref <50)
MDMA: NEGATIVE ng/mL (ref <500)
MORPHINE: NEGATIVE ng/mL (ref <50)
Marijuana Metabolite: NEGATIVE ng/mL (ref <20)
Norhydrocodone: 664 ng/mL — ABNORMAL HIGH (ref <50)
OPIATES: POSITIVE ng/mL — AB (ref <100)
OXIDANT: NEGATIVE ug/mL (ref <200)
Oxycodone: NEGATIVE ng/mL (ref <100)
PH: 6.33 (ref 4.5–9.0)

## 2017-09-02 ENCOUNTER — Ambulatory Visit: Payer: BLUE CROSS/BLUE SHIELD | Admitting: Family Medicine

## 2017-09-02 ENCOUNTER — Encounter: Payer: Self-pay | Admitting: Family Medicine

## 2017-09-02 VITALS — BP 142/80 | HR 70 | Temp 98.7°F | Wt 249.2 lb

## 2017-09-02 DIAGNOSIS — F419 Anxiety disorder, unspecified: Secondary | ICD-10-CM

## 2017-09-02 DIAGNOSIS — F4310 Post-traumatic stress disorder, unspecified: Secondary | ICD-10-CM | POA: Diagnosis not present

## 2017-09-02 MED ORDER — FLUOXETINE HCL 10 MG PO CAPS: 10.0000 mg | ORAL_CAPSULE | Freq: Every day | ORAL | 3 refills | Status: DC

## 2017-09-02 MED ORDER — LORAZEPAM 1 MG PO TABS: 1.0000 mg | ORAL_TABLET | Freq: Three times a day (TID) | ORAL | 0 refills | Status: DC | PRN
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
## 2017-09-29 ENCOUNTER — Telehealth: Payer: Self-pay

## 2017-09-29 NOTE — Telephone Encounter (Signed)
Fax from [address]   Please submit PA INS currently will cover 30 days supply every 90 days. Please ensure a PA is "back dated"  to todays date so pt can be refunded.  Thanks  P: [phone] F: [phone]  Sent to [facility]

## 2017-10-07 NOTE — Telephone Encounter (Signed)
[facility] and spoke with Alyse Low to ask what medication needed a prior auth.  Prior auth was sent to [email or web address] for Gabapentin with start date of 06/22/17 and approval given.  HUTMLY:[id number];KCLEXN:[id number];Review Type:Prior Auth;Coverage Start Date:09/07/2017;Coverage End Date:10/07/2018 and I called [facility] and informed Abby of this.

## 2017-10-29 ENCOUNTER — Telehealth: Payer: Self-pay

## 2017-10-29 NOTE — Telephone Encounter (Signed)
Fax from pharmacy call pharmacy for PA on Hydrocodone-acetamin    Placed on [address], [place]

## 2017-11-01 NOTE — Telephone Encounter (Signed)
Sent to PCP ?

## 2017-11-01 NOTE — Telephone Encounter (Signed)
Note in [email or web address] stated the request was denied.  Message sent to Dr Barbie Banner asst.

## 2017-11-01 NOTE — Telephone Encounter (Signed)
Prior auth sent to [email or web address].

## 2017-11-02 ENCOUNTER — Ambulatory Visit (INDEPENDENT_AMBULATORY_CARE_PROVIDER_SITE_OTHER): Payer: BLUE CROSS/BLUE SHIELD | Admitting: Family Medicine

## 2017-11-02 ENCOUNTER — Encounter: Payer: Self-pay | Admitting: Family Medicine

## 2017-11-02 VITALS — BP 120/72 | HR 80 | Temp 98.5°F | Wt 246.0 lb

## 2017-11-02 DIAGNOSIS — M79671 Pain in right foot: Secondary | ICD-10-CM | POA: Diagnosis not present

## 2017-11-02 DIAGNOSIS — L409 Psoriasis, unspecified: Secondary | ICD-10-CM | POA: Diagnosis not present

## 2017-11-02 DIAGNOSIS — G8929 Other chronic pain: Secondary | ICD-10-CM

## 2017-11-02 DIAGNOSIS — F1190 Opioid use, unspecified, uncomplicated: Secondary | ICD-10-CM | POA: Diagnosis not present

## 2017-11-02 DIAGNOSIS — M5440 Lumbago with sciatica, unspecified side: Secondary | ICD-10-CM | POA: Diagnosis not present

## 2017-11-02 MED ORDER — HYDROCODONE-ACETAMINOPHEN 10-325 MG PO TABS: 1.0000 | ORAL_TABLET | Freq: Four times a day (QID) | ORAL | 0 refills | Status: DC | PRN

## 2017-11-02 MED ORDER — TRIAMCINOLONE ACETONIDE 0.1 % EX CREA: 1.0000 "application " | TOPICAL_CREAM | Freq: Three times a day (TID) | CUTANEOUS | 2 refills | Status: DC

## 2017-11-02 NOTE — Telephone Encounter (Signed)
Placed in Dr. Barbie Banner red folder to review

## 2017-11-02 NOTE — Progress Notes (Signed)
   Subjective:    Patient ID: Marie Tran, female    DOB: 04-21-1971, 47 y.o.   MRN: [id number]  HPI Here for pain management. She is doing well in general. She does ask about 2 other issues. First she has had a painful lump on the right foot for about 3 months. No hx of trauma. Also she has had a scaly patch of skin on the right knee for 6 months. She does have psoriasis on the scalp.  Indication for chronic opioid: low back pain Medication and dose: Norco 10-325  # pills per month: 120 Last UDS date: 08-23-17 Pain contract signed (Y/N): 08-23-17                                                                                                                 Date narcotic database last reviewed (include red flags): 11-02-17     Review of Systems  Constitutional: Negative.   Respiratory: Negative.   Cardiovascular: Negative.   Musculoskeletal: Positive for back pain and joint swelling.  Skin: Positive for rash.       Objective:   Physical Exam  Constitutional: She appears well-developed and well-nourished.  Cardiovascular: Normal rate, regular rhythm, normal heart sounds and intact distal pulses.  Pulmonary/Chest: Effort normal and breath sounds normal. No respiratory distress. She has no wheezes. She has no rales.  Musculoskeletal:  The right medial foot has a tender 1 cm round mass under the skin  Skin:  The right anterior knee has a small raised patch of scaly skin           Assessment & Plan:  Pain management, meds were refilled. She has a ganglion cyst on the right foot ans we will refer her to Podiatry for this. She has a patch of psoriasis on the right knee, apply Triamcinolone cream prn.  [name], MD

## 2017-11-02 NOTE — Telephone Encounter (Signed)
Sent to PCP to advise 

## 2017-11-02 NOTE — Telephone Encounter (Signed)
Noted. When I met with her in the clinic, she understands and plans to pay for this medication with cash.

## 2017-11-02 NOTE — Telephone Encounter (Signed)
Fax received from [facility] with denial info and this was given to Dr Barbie Banner asst.

## 2017-11-24 ENCOUNTER — Other Ambulatory Visit: Payer: Self-pay | Admitting: Family Medicine

## 2017-11-24 NOTE — Telephone Encounter (Signed)
Last OV 11/02/2017   Last refilled 11/02/2016 disp 90 with 3 refills

## 2017-11-25 ENCOUNTER — Ambulatory Visit (INDEPENDENT_AMBULATORY_CARE_PROVIDER_SITE_OTHER): Payer: BLUE CROSS/BLUE SHIELD

## 2017-11-25 ENCOUNTER — Ambulatory Visit: Payer: BLUE CROSS/BLUE SHIELD | Admitting: Podiatry

## 2017-11-25 DIAGNOSIS — M659 Unspecified synovitis and tenosynovitis, unspecified site: Secondary | ICD-10-CM

## 2017-11-25 DIAGNOSIS — M79671 Pain in right foot: Secondary | ICD-10-CM | POA: Diagnosis not present

## 2017-11-25 DIAGNOSIS — G8929 Other chronic pain: Secondary | ICD-10-CM

## 2017-11-25 DIAGNOSIS — L989 Disorder of the skin and subcutaneous tissue, unspecified: Secondary | ICD-10-CM | POA: Diagnosis not present

## 2017-11-25 DIAGNOSIS — M722 Plantar fascial fibromatosis: Secondary | ICD-10-CM | POA: Diagnosis not present

## 2017-11-25 MED ORDER — MELOXICAM 15 MG PO TABS: 15.0000 mg | ORAL_TABLET | Freq: Every day | ORAL | 0 refills | Status: DC

## 2017-12-14 NOTE — Progress Notes (Signed)
Subjective:  Patient ID: Marie Tran, female    DOB: 1970-10-03,  MRN: [id number]  Chief Complaint  Patient presents with  . Foot Pain    chronic right foot pain    47 y.o. female presents with the above complaint. Reports pain in the R foot on the inside down into the arch. Sometimes runs up her leg. Chronic in nature, hurt for several months. Denies known injury. Also complains of skin condition to the R leg but unsure what it is.  Past Medical History:  Diagnosis Date  . Breast lump    right breast, UOQ, intramammary lymph node, also left breast cyst   . Complication of anesthesia    hard to wake up after lap choli  . Fatty liver disease, nonalcoholic   . Hoarseness   . Hyperlipidemia   . Hypothyroidism   . Migraines   . Neck pain, chronic    sees Dr. Phylliss Bob  . Obesity   . Overactive bladder   . PONV (postoperative nausea and vomiting)    had to have scope patch last surgery  . Snores   . Wears dentures    top   Past Surgical History:  Procedure Laterality Date  . CERVICAL DISC SURGERY  3/14   at [facility] per Dr. Lynann Bologna   . CHOLECYSTECTOMY  2009  . ROTATOR CUFF REPAIR W/ DISTAL CLAVICLE EXCISION Right 09-05-13   per Dr. Tamera Punt   . TUBAL LIGATION      Current Outpatient Medications:  .  atorvastatin (LIPITOR) 40 MG tablet, TAKE 1 TABLET BY MOUTH EVERY DAY, Disp: 90 tablet, Rfl: 0 .  ferrous sulfate 325 (65 FE) MG tablet, Take 1 tablet (325 mg total) daily with breakfast by mouth., Disp: 90 tablet, Rfl: 3 .  FLUoxetine (PROZAC) 10 MG capsule, Take 1 capsule (10 mg total) by mouth daily., Disp: 30 capsule, Rfl: 3 .  gabapentin (NEURONTIN) 100 MG capsule, Take 1 capsule (100 mg total) 2 (two) times daily by mouth., Disp: 60 capsule, Rfl: 5 .  HYDROcodone-acetaminophen (NORCO) 10-325 MG tablet, Take 1 tablet by mouth every 6 (six) hours as needed for severe pain., Disp: 120 tablet, Rfl: 0 .  levothyroxine (SYNTHROID, LEVOTHROID) 200 MCG tablet, TAKE 1  TABLET BY MOUTH EVERY DAY BEFORE BREAKFAST, Disp: 90 tablet, Rfl: 0 .  LORazepam (ATIVAN) 1 MG tablet, Take 1 tablet (1 mg total) by mouth every 8 (eight) hours as needed for anxiety., Disp: 60 tablet, Rfl: 0 .  meloxicam (MOBIC) 15 MG tablet, Take 1 tablet (15 mg total) by mouth daily., Disp: 30 tablet, Rfl: 0 .  Multiple Vitamins-Minerals (MULTIVITAMIN WITH MINERALS) tablet, Take 1 tablet by mouth daily., Disp: , Rfl:  .  omeprazole (PRILOSEC) 40 MG capsule, TAKE 1 CAPSULE (40 MG TOTAL) BY MOUTH EVERY EVENING., Disp: 90 capsule, Rfl: 2 .  phentermine (ADIPEX-P) 37.5 MG tablet, TAKE 1 TABLET BY MOUTH DAILY BEFORE BREAKFAST, Disp: 90 tablet, Rfl: 1 .  SUMAtriptan (IMITREX) 100 MG tablet, Take 1 tablet (100 mg total) as needed by mouth for migraine. May repeat in 2 hours if headache persists or recurs., Disp: 10 tablet, Rfl: 5 .  triamcinolone cream (KENALOG) 0.1 %, Apply 1 application topically 3 (three) times daily., Disp: 45 g, Rfl: 2  No Known Allergies Review of Systems: Negative except as noted in the HPI. Denies N/V/F/Ch. Objective:  There were no vitals filed for this visit. General AA&O x3. Normal mood and affect.  Vascular Dorsalis pedis and posterior tibial  pulses  present 2+ bilaterally  Capillary refill normal to all digits. Pedal hair growth normal.  Neurologic Epicritic sensation grossly present.  Dermatologic R leg excematous lesion Interspaces clear of maceration. Nails well groomed and normal in appearance.  Orthopedic: MMT 5/5 in dorsiflexion, plantarflexion, inversion, and eversion. Normal joint ROM without pain or crepitus. POP R medial arch, slight pain medial calc tuber.   Assessment & Plan:  Patient was evaluated and treated and all questions answered.  Plantar Fasciitis, right - XR reviewed as above.  - Educated on icing and stretching. Instructions given.  -Will schedule patient for appt for orthotics. -Rx Meloxicam  Lesion R Leg -Will have patient come  back in 2 weeks for biopsy  Return in about 2 weeks (around 12/09/2017).

## 2017-12-16 ENCOUNTER — Ambulatory Visit: Payer: BLUE CROSS/BLUE SHIELD | Admitting: Podiatry

## 2017-12-16 ENCOUNTER — Ambulatory Visit: Payer: BLUE CROSS/BLUE SHIELD | Admitting: Orthotics

## 2017-12-16 DIAGNOSIS — M76821 Posterior tibial tendinitis, right leg: Secondary | ICD-10-CM | POA: Diagnosis not present

## 2017-12-16 DIAGNOSIS — M779 Enthesopathy, unspecified: Secondary | ICD-10-CM

## 2017-12-16 DIAGNOSIS — R269 Unspecified abnormalities of gait and mobility: Secondary | ICD-10-CM

## 2017-12-16 DIAGNOSIS — L989 Disorder of the skin and subcutaneous tissue, unspecified: Secondary | ICD-10-CM

## 2017-12-16 DIAGNOSIS — M722 Plantar fascial fibromatosis: Secondary | ICD-10-CM

## 2017-12-16 NOTE — Progress Notes (Signed)
Patient left without being seen.

## 2017-12-16 NOTE — Progress Notes (Signed)
    Subjective:  Patient ID: Marie Tran, female    DOB: Sep 24, 1970,  MRN: [id number]  No chief complaint on file.  47 y.o. female returns for the above complaint.  States she still having pain and it is not any better.  Reports trouble sleeping quite a time because the pain is so bad.  By her right leg  Objective:  There were no vitals filed for this visit. General AA&O x3. Normal mood and affect.  Vascular Pedal pulses palpable.  Neurologic Epicritic sensation grossly intact.  Dermatologic Raised plaque-like lesion R leg.  Orthopedic:  Pain palpation about the navicular tuberosity right no pain palpation about the medial calcaneal tuber.   Assessment & Plan:  Patient was evaluated and treated and all questions answered.  PTTD Tendonitis -XR reviewed. Old chip fracture -Injection to tendon today.  -Immobilize in boot -Should pain persist would consider MRI  Procedure: Injection Tendon/Ligament Consent: Verbal consent obtained. Location: Right navicular tuberosity. Skin Prep: Alcohol. Injectate: -0.5 cc 0.5% marcaine plain, 0.5 cc dexamethasone phosphate,  Disposition: Patient tolerated procedure well. Injection site dressed with a band-aid.  Lesion R Leg -Consent signed for procedure. -Punch biopsy performed as below.  Procedure: Punch Biopsy Indication: R Leg Anesthesia: Lidocaine 1% with epi 1.5 ml Instrumentation: 2 mm punch biopsy Technique: Sharp excisional debridement to bleeding, viable wound base.  Dressing: Dry, sterile, compression dressing. Specimen: Labeled and sent for pathology. Disposition: Patient tolerated procedure well. Leave dressing on for 48 hours. Thereafter dress with antibiotic ointment and band-aid. Patient to return in 2 week for follow-up.  Return in about 2 weeks (around 12/30/2017) for Tendonitis.

## 2017-12-17 ENCOUNTER — Telehealth: Payer: Self-pay | Admitting: *Deleted

## 2017-12-17 ENCOUNTER — Encounter: Payer: Self-pay | Admitting: Podiatry

## 2017-12-17 NOTE — Telephone Encounter (Signed)
Left message for pt to call [phone] and leave message with [employer] light duty requirements and I would type a letter.

## 2017-12-17 NOTE — Telephone Encounter (Signed)
Pt states Dr. March Rummage put in a cam boot an her assist manager wants a note for her to be on light duty for the next 2 weeks.

## 2017-12-30 ENCOUNTER — Ambulatory Visit: Payer: BLUE CROSS/BLUE SHIELD | Admitting: Podiatry

## 2017-12-30 ENCOUNTER — Encounter: Payer: Self-pay | Admitting: Podiatry

## 2017-12-30 DIAGNOSIS — M722 Plantar fascial fibromatosis: Secondary | ICD-10-CM | POA: Diagnosis not present

## 2017-12-30 DIAGNOSIS — M779 Enthesopathy, unspecified: Secondary | ICD-10-CM

## 2017-12-30 DIAGNOSIS — M76821 Posterior tibial tendinitis, right leg: Secondary | ICD-10-CM

## 2017-12-30 MED ORDER — TRIAMCINOLONE ACETONIDE 0.5 % EX OINT: TOPICAL_OINTMENT | CUTANEOUS | 0 refills | Status: DC

## 2017-12-31 ENCOUNTER — Telehealth: Payer: Self-pay | Admitting: *Deleted

## 2017-12-31 DIAGNOSIS — M76821 Posterior tibial tendinitis, right leg: Secondary | ICD-10-CM

## 2017-12-31 DIAGNOSIS — D3610 Benign neoplasm of peripheral nerves and autonomic nervous system, unspecified: Secondary | ICD-10-CM

## 2017-12-31 DIAGNOSIS — M659 Synovitis and tenosynovitis, unspecified: Secondary | ICD-10-CM

## 2017-12-31 NOTE — Telephone Encounter (Signed)
Dr. March Rummage ordered MRI right foot with and without contrast for r/o neuroma and right ankle without contrast for posterior tibial tendonitis, synovitis and tenosynovitis, pt would like at the [facility] location.

## 2017-12-31 NOTE — Telephone Encounter (Signed)
Faxed orders to [facility]. Orders given to D. Meadows for [employer].

## 2017-12-31 NOTE — Telephone Encounter (Signed)
I left message informing pt there was no [facility] radiology location, we would order at [facility] on [address] that runs into [place] from [place].

## 2018-01-01 ENCOUNTER — Other Ambulatory Visit: Payer: Self-pay | Admitting: Podiatry

## 2018-01-05 ENCOUNTER — Other Ambulatory Visit: Payer: Self-pay | Admitting: Podiatry

## 2018-01-05 ENCOUNTER — Telehealth: Payer: Self-pay | Admitting: *Deleted

## 2018-01-05 NOTE — Telephone Encounter (Signed)
"  Tommye Standard needs authorization for a right foot MRI with and without contrast and a right ankle without contrast.   [employer] of [place] which I think goes through [employer]. She's scheduled for May 30."

## 2018-01-07 NOTE — Telephone Encounter (Signed)
I called and left Marie Tran a message about the authorization for the MRI.  The authorization number is [id number] and it is valid from 01/07/18 through 03/08/18.

## 2018-01-09 ENCOUNTER — Other Ambulatory Visit: Payer: BLUE CROSS/BLUE SHIELD

## 2018-01-11 ENCOUNTER — Other Ambulatory Visit: Payer: Self-pay | Admitting: Family Medicine

## 2018-01-12 ENCOUNTER — Telehealth: Payer: Self-pay | Admitting: *Deleted

## 2018-01-12 ENCOUNTER — Other Ambulatory Visit: Payer: Self-pay | Admitting: Podiatry

## 2018-01-12 NOTE — Telephone Encounter (Signed)
"  I'm following up about the authorization.  She's scheduled for a foot and an ankle.  It looks like only the foot for authorized.  We need authorization for the ankle too.  She's coming in for her MRI tomorrow at 6 pm.  It's [facility] and it goes through [employer].

## 2018-01-13 ENCOUNTER — Other Ambulatory Visit: Payer: Self-pay | Admitting: Podiatry

## 2018-01-13 ENCOUNTER — Ambulatory Visit
Admission: RE | Admit: 2018-01-13 | Discharge: 2018-01-13 | Disposition: A | Discharge status: Home / Self Care | Payer: BLUE CROSS/BLUE SHIELD | Source: Ambulatory Visit | Attending: Podiatry | Admitting: Podiatry

## 2018-01-13 ENCOUNTER — Ambulatory Visit: Payer: BLUE CROSS/BLUE SHIELD | Admitting: Podiatry

## 2018-01-13 DIAGNOSIS — M659 Synovitis and tenosynovitis, unspecified: Secondary | ICD-10-CM

## 2018-01-13 DIAGNOSIS — D3610 Benign neoplasm of peripheral nerves and autonomic nervous system, unspecified: Secondary | ICD-10-CM

## 2018-01-13 DIAGNOSIS — M76821 Posterior tibial tendinitis, right leg: Secondary | ICD-10-CM

## 2018-01-13 MED ORDER — GADOBENATE DIMEGLUMINE 529 MG/ML IV SOLN
20.0000 mL | Freq: Once | INTRAVENOUS | Status: AC | PRN
  Administered 2018-01-13: 20 mL via INTRAVENOUS

## 2018-01-13 NOTE — Telephone Encounter (Signed)
I left Noelle a message that the MRI ankle has been authorized.  The authorization number is [id number].  It is valid from 01/13/2018 to 03/14/2018.

## 2018-01-21 ENCOUNTER — Encounter: Payer: Self-pay | Admitting: Podiatry

## 2018-01-21 ENCOUNTER — Ambulatory Visit: Payer: BLUE CROSS/BLUE SHIELD | Admitting: Podiatry

## 2018-01-21 DIAGNOSIS — M659 Synovitis and tenosynovitis, unspecified: Secondary | ICD-10-CM

## 2018-01-21 DIAGNOSIS — M779 Enthesopathy, unspecified: Secondary | ICD-10-CM | POA: Diagnosis not present

## 2018-01-21 DIAGNOSIS — M76821 Posterior tibial tendinitis, right leg: Secondary | ICD-10-CM | POA: Diagnosis not present

## 2018-01-21 MED ORDER — METHYLPREDNISOLONE 4 MG PO TBPK
ORAL_TABLET | ORAL | 0 refills | Status: DC
Start: 2018-01-21 — End: 2018-02-11

## 2018-01-25 ENCOUNTER — Telehealth: Payer: Self-pay

## 2018-01-25 NOTE — Telephone Encounter (Signed)
Fax from pharmacy PA needed for Hydrocodone-acetamin   [facility]   PA is requeued   Pt is sating that the PA has been done but the insurance has NOT record.   Placed at [employer]

## 2018-01-27 NOTE — Telephone Encounter (Signed)
Marie Tran stated the pt used a discount card also.

## 2018-01-27 NOTE — Telephone Encounter (Signed)
I called [facility] and spoke with Alver Fisher as this a PA for this medication was denied on 3/19.  She stated the pt picked up the Rx on 6/7.

## 2018-02-07 ENCOUNTER — Ambulatory Visit: Payer: BLUE CROSS/BLUE SHIELD | Admitting: Family Medicine

## 2018-02-10 ENCOUNTER — Ambulatory Visit: Payer: BLUE CROSS/BLUE SHIELD | Admitting: Family Medicine

## 2018-02-11 ENCOUNTER — Ambulatory Visit (INDEPENDENT_AMBULATORY_CARE_PROVIDER_SITE_OTHER): Payer: BLUE CROSS/BLUE SHIELD | Admitting: Family Medicine

## 2018-02-11 ENCOUNTER — Encounter: Payer: Self-pay | Admitting: Family Medicine

## 2018-02-11 VITALS — BP 120/78 | HR 91 | Temp 98.6°F | Ht 65.0 in | Wt 258.0 lb

## 2018-02-11 DIAGNOSIS — M5440 Lumbago with sciatica, unspecified side: Secondary | ICD-10-CM

## 2018-02-11 DIAGNOSIS — G8929 Other chronic pain: Secondary | ICD-10-CM

## 2018-02-11 DIAGNOSIS — F1190 Opioid use, unspecified, uncomplicated: Secondary | ICD-10-CM

## 2018-02-11 MED ORDER — HYDROCODONE-ACETAMINOPHEN 10-325 MG PO TABS: 1.0000 | ORAL_TABLET | Freq: Four times a day (QID) | ORAL | 0 refills | Status: DC | PRN

## 2018-02-11 NOTE — Progress Notes (Signed)
   Subjective:    Patient ID: Marie Tran, female    DOB: [birth year], 47 y.o.   MRN: [id number]  HPI Here for pain management. She is doing well as far as her back goes. She will be having surgery soon on the right foot to repair a metatarsal fracture and some torn ligaments.  Indication for chronic opioid: low back pain Medication and dose: Norco 10-325 # pills per month: 120 Last UDS date: 08-23-17 Opioid Treatment Agreement signed (Y/N): 11-02-17 Opioid Treatment Agreement last reviewed with patient:  02-11-18 NCCSRS reviewed this encounter (include red flags):  02-11-18    Review of Systems  Constitutional: Negative.   Respiratory: Negative.   Cardiovascular: Negative.   Musculoskeletal: Positive for arthralgias and back pain.  Neurological: Negative.        Objective:   Physical Exam  Constitutional: She is oriented to person, place, and time. She appears well-developed and well-nourished.  Wearing a boot on the right foot   Cardiovascular: Normal rate, regular rhythm, normal heart sounds and intact distal pulses.  Pulmonary/Chest: Effort normal and breath sounds normal. No stridor. No respiratory distress. She has no wheezes. She has no rales.  Neurological: She is alert and oriented to person, place, and time.          Assessment & Plan:  Pain management, meds were refilled.  [name], MD

## 2018-02-24 ENCOUNTER — Encounter: Payer: Self-pay | Admitting: Podiatry

## 2018-02-24 ENCOUNTER — Ambulatory Visit (INDEPENDENT_AMBULATORY_CARE_PROVIDER_SITE_OTHER): Payer: BLUE CROSS/BLUE SHIELD | Admitting: Podiatry

## 2018-02-24 DIAGNOSIS — M779 Enthesopathy, unspecified: Secondary | ICD-10-CM

## 2018-02-24 DIAGNOSIS — M76821 Posterior tibial tendinitis, right leg: Secondary | ICD-10-CM | POA: Diagnosis not present

## 2018-02-24 NOTE — Patient Instructions (Signed)
Pre-Operative Instructions  Congratulations, you have decided to take an important step towards improving your quality of life.  You can be assured that the doctors and staff at [facility] will be with you every step of the way.  Here are some important things you should know:  1. Plan to be at the surgery center/hospital at least 1 (one) hour prior to your scheduled time, unless otherwise directed by the surgical center/hospital staff.  You must have a responsible adult accompany you, remain during the surgery and drive you home.  Make sure you have directions to the surgical center/hospital to ensure you arrive on time. 2. If you are having surgery at [facility] or [facility] hospitals, you will need a copy of your medical history and physical form from your family physician within one month prior to the date of surgery. We will give you a form for your primary physician to complete.  3. We make every effort to accommodate the date you request for surgery.  However, there are times where surgery dates or times have to be moved.  We will contact you as soon as possible if a change in schedule is required.   4. No aspirin/ibuprofen for one week before surgery.  If you are on aspirin, any non-steroidal anti-inflammatory medications (Mobic, Aleve, Ibuprofen) should not be taken seven (7) days prior to your surgery.  You make take Tylenol for pain prior to surgery.  5. Medications - If you are taking daily heart and blood pressure medications, seizure, reflux, allergy, asthma, anxiety, pain or diabetes medications, make sure you notify the surgery center/hospital before the day of surgery so they can tell you which medications you should take or avoid the day of surgery. 6. No food or drink after midnight the night before surgery unless directed otherwise by surgical center/hospital staff. 7. No alcoholic beverages 24-hours prior to surgery.  No smoking 24-hours prior or 24-hours after  surgery. 8. Wear loose pants or shorts. They should be loose enough to fit over bandages, boots, and casts. 9. Don't wear slip-on shoes. Sneakers are preferred. 10. Bring your boot with you to the surgery center/hospital.  Also bring crutches or a walker if your physician has prescribed it for you.  If you do not have this equipment, it will be provided for you after surgery. 11. If you have not been contacted by the surgery center/hospital by the day before your surgery, call to confirm the date and time of your surgery. 12. Leave-time from work may vary depending on the type of surgery you have.  Appropriate arrangements should be made prior to surgery with your employer. 13. Prescriptions will be provided immediately following surgery by your doctor.  Fill these as soon as possible after surgery and take the medication as directed. Pain medications will not be refilled on weekends and must be approved by the doctor. 14. Remove nail polish on the operative foot and avoid getting pedicures prior to surgery. 15. Wash the night before surgery.  The night before surgery wash the foot and leg well with water and the antibacterial soap provided. Be sure to pay special attention to beneath the toenails and in between the toes.  Wash for at least three (3) minutes. Rinse thoroughly with water and dry well with a towel.  Perform this wash unless told not to do so by your physician.  Enclosed: 1 Ice pack (please put in freezer the night before surgery)   1 Hibiclens skin cleaner     Pre-op instructions  If you have any questions regarding the instructions, please do not hesitate to call our office.  [place]: [address], [place], [place] [address] -- [phone]  [place]: [address] [place], [place] [address] -- [phone]  [place]: [address]  , [place] [address] -- [phone]  [place]: [address], [address], [place], [place] [address] -- [phone]  Website:  [email or web address] 

## 2018-02-24 NOTE — Progress Notes (Signed)
    Subjective:  Patient ID: Marie Tran, female    DOB: 04/02/1971,  MRN: [id number]  Chief Complaint  Patient presents with  . Foot Pain    right -4 week follow up   47 y.o. female returns for the above complaint.  Having a lot of pain.  Overall only mild improvement from beginning of care to having pain in the inside of the ankle.  Objective:  There were no vitals filed for this visit. General AA&O x3. Normal mood and affect.  Vascular Pedal pulses palpable.  Neurologic Epicritic sensation grossly intact.  Dermatologic Raised plaque-like lesion R leg.  Orthopedic:  Pain palpation about the navicular tuberosity right no pain palpation about the medial calcaneal tuber.   Assessment & Plan:  Patient was evaluated and treated and all questions answered.  PTTD Tendonitis with prominent navicular -At this point patient has failed conservative therapy consisting of injections immobilization, rest, antifungal medicines.  Would benefit from surgical intervention.  Will perform navicular resection with debridement of the posterior tibial tendon possible repair.  Return for post op.

## 2018-02-28 ENCOUNTER — Telehealth: Payer: Self-pay | Admitting: *Deleted

## 2018-02-28 NOTE — Telephone Encounter (Signed)
I left the patient a message to give me a call back to verify she's supposed to be scheduled for her procedure on 03/09/2018.

## 2018-03-01 NOTE — Telephone Encounter (Signed)
"  I was calling to speak with someone.  I was scheduled for foot surgery on July 24.  I need you to give me a call back.  I'm at work right now but I get off at 3 pm."  I was calling to see if July 24 was the date you wanted for your surgery.  "Yes, that date is fine."  Someone from the surgical center will give you a call a day or two prior to your surgery date and they will give you your arrival time.  You can register with the surgical center on-line.  "I've already done that last night and they sent me an email back."  Okay, that is great.

## 2018-03-06 NOTE — Progress Notes (Signed)
    Subjective:  Patient ID: Marie Tran, female    DOB: 02/09/1971,  MRN: [id number]  Chief Complaint  Patient presents with  . Foot Pain    right - MRI results today   47 y.o. female returns for the above complaint.  Had the MRI performed here for review  Objective:  There were no vitals filed for this visit. General AA&O x3. Normal mood and affect.  Vascular Pedal pulses palpable.  Neurologic Epicritic sensation grossly intact.  Dermatologic Raised plaque-like lesion R leg.  Orthopedic:  Pain palpation about the navicular tuberosity right no pain palpation about the medial calcaneal tuber.   MRI 5/30 1. Moderate tendinosis and type 1 tearing of the distal posterior tibialis tendon. Associated surrounding soft tissue edema and mild tenosynovitis. 2. Mild peroneal tenosynovitis. 3. No acute osseous findings.  Assessment & Plan:  Patient was evaluated and treated and all questions answered.  PTTD Tendonitis -MRI reviewed as above.  Will hold off surgical intervention at this time.  Rx Medrol Pak continue mobilization  Lesion R Leg -Path report reviewed.  Spongiotic dermatitis -Rx triamcinolone cream  Return in about 1 month (around 02/18/2018).

## 2018-03-06 NOTE — Progress Notes (Addendum)
    Subjective:  Patient ID: Clementeen Hoof, female    DOB: 1970/09/10,  MRN: [id number]  Chief Complaint  Patient presents with  . Plantar Fasciitis    right foot - not much better. Needs letter with specific restrictions for light duty at work -works at [employer].   47 y.o. female returns for the above complaint.  States the right foot is still not much better.  Objective:  There were no vitals filed for this visit. General AA&O x3. Normal mood and affect.  Vascular Pedal pulses palpable.  Neurologic Epicritic sensation grossly intact.  Dermatologic Raised plaque-like lesion R leg.  Orthopedic:  Pain palpation about the navicular tuberosity right no pain palpation about the medial calcaneal tuber.   Assessment & Plan:  Patient was evaluated and treated and all questions answered.  PTTD Tendonitis -Due to persistent pain will order an MRI. -Patient still conservative therapy of immobilization, rest, antifungal medicines  Lesion R Leg -Path report reviewed.  Spongiotic dermatitis -Rx triamcinolone cream  Return in about 2 weeks (around 01/13/2018) for MRI F/u.

## 2018-03-09 ENCOUNTER — Other Ambulatory Visit: Payer: Self-pay | Admitting: Podiatry

## 2018-03-09 ENCOUNTER — Telehealth: Payer: Self-pay | Admitting: *Deleted

## 2018-03-09 ENCOUNTER — Encounter: Payer: Self-pay | Admitting: Podiatry

## 2018-03-09 DIAGNOSIS — M67873 Other specified disorders of tendon, right ankle and foot: Secondary | ICD-10-CM

## 2018-03-09 DIAGNOSIS — Z9889 Other specified postprocedural states: Secondary | ICD-10-CM

## 2018-03-09 DIAGNOSIS — M76821 Posterior tibial tendinitis, right leg: Secondary | ICD-10-CM

## 2018-03-09 MED ORDER — OXYCODONE-ACETAMINOPHEN 10-325 MG PO TABS: 1.0000 | ORAL_TABLET | ORAL | 0 refills | Status: DC | PRN

## 2018-03-09 MED ORDER — ONDANSETRON HCL 4 MG PO TABS: 4.0000 mg | ORAL_TABLET | Freq: Three times a day (TID) | ORAL | 0 refills | Status: DC | PRN

## 2018-03-09 MED ORDER — CEPHALEXIN 500 MG PO CAPS: 500.0000 mg | ORAL_CAPSULE | Freq: Two times a day (BID) | ORAL | 0 refills | Status: DC

## 2018-03-09 NOTE — Telephone Encounter (Signed)
[facility]re[facility] Beards [facility] states she spoke to pt's husband and she is in surgery, husband states Dr. March Rummage had wanted pt to take the Percocet because it would cover her initial post op pain. Threasa Beards states she feels pt's husband has a good idea of how pt is to take the medication and she will instruct pt and family when medication is picked up.

## 2018-03-09 NOTE — Telephone Encounter (Signed)
-----   Message from Evelina Bucy, DPM sent at 03/09/2018  1:56 PM EDT ----- Regarding: Knee scooter Can we order knee scooter for patient?

## 2018-03-09 NOTE — Telephone Encounter (Signed)
Faxed orders for knee scooter and emailed to [facility].

## 2018-03-09 NOTE — Telephone Encounter (Signed)
Marie Tran [facility] has a monthly rx for #120 Norco and Dr. March Rummage prescribed #20 Percocet. I told [facility] to hold the P[facility]et until I spoke with DR. Price for instructions.

## 2018-03-09 NOTE — Progress Notes (Signed)
Rx sent to pharmacy   

## 2018-03-10 NOTE — Progress Notes (Signed)
03/10/2018  Called and spoke with patient. She is doing well after surgery yesterday. Advised to keep her appointment for her first post op on Friday 03/11/18.She will call back with any questions or problems.

## 2018-03-10 NOTE — Progress Notes (Signed)
DOS 03/09/2018   Resection of navicular bone with debridement and repair of posterior tendon right.

## 2018-03-11 ENCOUNTER — Ambulatory Visit (INDEPENDENT_AMBULATORY_CARE_PROVIDER_SITE_OTHER): Payer: Self-pay | Admitting: Podiatry

## 2018-03-11 ENCOUNTER — Telehealth: Payer: Self-pay | Admitting: *Deleted

## 2018-03-11 ENCOUNTER — Ambulatory Visit: Payer: BLUE CROSS/BLUE SHIELD

## 2018-03-11 ENCOUNTER — Ambulatory Visit (INDEPENDENT_AMBULATORY_CARE_PROVIDER_SITE_OTHER): Payer: BLUE CROSS/BLUE SHIELD

## 2018-03-11 VITALS — BP 154/94 | HR 80 | Temp 96.7°F

## 2018-03-11 DIAGNOSIS — M76821 Posterior tibial tendinitis, right leg: Secondary | ICD-10-CM

## 2018-03-11 DIAGNOSIS — Z9889 Other specified postprocedural states: Secondary | ICD-10-CM

## 2018-03-11 NOTE — Telephone Encounter (Signed)
Marie Tran states she needs information concerning pt's surgery date, procedures, ICD.10 code, CPT codes, if inpt or outpt, next appt, restrictions.

## 2018-03-15 ENCOUNTER — Telehealth: Payer: Self-pay | Admitting: Podiatry

## 2018-03-15 MED ORDER — OXYCODONE-ACETAMINOPHEN 10-325 MG PO TABS: 1.0000 | ORAL_TABLET | ORAL | 0 refills | Status: DC | PRN

## 2018-03-15 NOTE — Addendum Note (Signed)
Addended by: Harriett Sine D on: 03/15/2018 11:54 AM   Modules accepted: Orders

## 2018-03-15 NOTE — Telephone Encounter (Signed)
I had called the pt to reschedule her appointment time for her second post op visit this Friday. While on the phone, the pt asked if she could get a refill of the percocet 10-325.

## 2018-03-15 NOTE — Progress Notes (Signed)
Subjective: Marie Tran is a 47 y.o. is seen today in office s/p right posterior tibial tendon repair, accessory navicular excision preformed on 03/09/2018. They state their pain is controlled.  She is remained in the cam boot nonweightbearing.  Taking pain medicine as needed. Denies any systemic complaints such as fevers, chills, nausea, vomiting. No calf pain, chest pain, shortness of breath.   Objective: General: No acute distress, AAOx3  DP/PT pulses palpable 2/4, CRT < 3 sec to all digits.  Protective sensation intact. Motor function intact.  RIGHT foot: Incision is well coapted without any evidence of dehiscence and staples are intact. There is no surrounding erythema, ascending cellulitis, fluctuance, crepitus, malodor, drainage/purulence. There is mild to moderate edema around the surgical site. There is mild pain along the surgical site.  No signs of infection. No other areas of tenderness to bilateral lower extremities.  No other open lesions or pre-ulcerative lesions.  No pain with calf compression, swelling, warmth, erythema.   Assessment and Plan:  Status post right foot surgery, doing well with no complications   -Treatment options discussed including all alternatives, risks, and complications -X-rays were obtained reviewed.  Status post accessory navicular excision.  Discussed there is a small radiolucency in the talus and this should heal uneventfully.  -Continue to be nonweightbearing.  Given her swelling will hold off on applying a cast today.  -Ice/elevation -Pain medication as needed. -Monitor for any clinical signs or symptoms of infection and DVT/PE and directed to call the office immediately should any occur or go to the ER. -Follow-up as scheduled with Dr. March Rummage or sooner if any problems arise. In the meantime, encouraged to call the office with any questions, concerns, change in symptoms.   Celesta Gentile, DPM

## 2018-03-15 NOTE — Telephone Encounter (Signed)
I informed pt, that Dr. March Rummage had okayed the refill of the percocet and she could pick it up in the [facility]. Pt states she will send her husband.

## 2018-03-18 ENCOUNTER — Ambulatory Visit (INDEPENDENT_AMBULATORY_CARE_PROVIDER_SITE_OTHER): Payer: BLUE CROSS/BLUE SHIELD | Admitting: Podiatry

## 2018-03-18 DIAGNOSIS — M76821 Posterior tibial tendinitis, right leg: Secondary | ICD-10-CM | POA: Diagnosis not present

## 2018-03-18 DIAGNOSIS — Z9889 Other specified postprocedural states: Secondary | ICD-10-CM

## 2018-03-20 NOTE — Progress Notes (Signed)
  Subjective:  Patient ID: Marie Tran, female    DOB: 12-09-1970,  MRN: [id number]  Chief Complaint  Patient presents with  . Routine Post [employer] 07.24.2019 Repair Post Tibial Tendon/Secondary Rt, Tenolysis Rt, Kidner Advanced Tendon Rt " my foot is feeling better this week although there are times when it hurts but the pain is manageable"     DOS: 03/09/18 Procedure: Right Kidner seizure with tenolysis and anchoring of PT tendon  47 y.o. female returns for post-op check. Denies N/V/F/Ch. Pain is controlled with current medications.  Objective:   General AA&O x3. Normal mood and affect.  Vascular Foot warm and well perfused.  Neurologic Gross sensation intact.  Dermatologic Skin healing well without signs of infection. Skin edges well coapted without signs of infection.  Orthopedic: Tenderness to palpation noted about the surgical site.    Assessment & Plan:  Patient was evaluated and treated and all questions answered.  S/p  Right Kidner seizure with tenolysis and anchoring of PT tendon -Progressing as expected post-operatively. -Staples: intact. -Medications refilled: none -Short leg cast applied -Foot redressed.  Return in about 1 week (around 03/25/2018) for Marie Tran patient.

## 2018-03-21 ENCOUNTER — Other Ambulatory Visit: Payer: Self-pay | Admitting: Podiatry

## 2018-03-22 ENCOUNTER — Telehealth: Payer: Self-pay | Admitting: *Deleted

## 2018-03-22 NOTE — Telephone Encounter (Signed)
Copied from [place] [id number]. Topic: Inquiry >> Mar 22, 2018  3:18 PM Scherrie Gerlach wrote: Reason for CRM: pt calling to follow up on a fax (resent by [employer] today) that needs to be signed by Dr Sarajane Jews stating that this was not a pre existing condition and Dr Sarajane Jews never treated pt for this. Pt states they are holding her money until they get this information back.  Fax  [phone] Attn: Clarene Essex

## 2018-03-23 ENCOUNTER — Telehealth: Payer: Self-pay | Admitting: *Deleted

## 2018-03-23 ENCOUNTER — Other Ambulatory Visit: Payer: Self-pay | Admitting: Podiatry

## 2018-03-23 MED ORDER — OXYCODONE-ACETAMINOPHEN 10-325 MG PO TABS: 1.0000 | ORAL_TABLET | ORAL | 0 refills | Status: DC | PRN

## 2018-03-23 NOTE — Progress Notes (Signed)
Rx sent to patient's pharmacy as requested.

## 2018-03-23 NOTE — Telephone Encounter (Signed)
I do not have this form/fax

## 2018-03-23 NOTE — Telephone Encounter (Signed)
I informed pt Dr. March Rummage had sent the pain [id number].

## 2018-03-24 NOTE — Telephone Encounter (Signed)
Called and spoke with pt to advise that we have not received this fax. Pt is aware and will see if this can be refaxed or she will bring a copy to our office if all else fails.

## 2018-03-24 NOTE — Telephone Encounter (Signed)
I wrote a letter about this

## 2018-03-24 NOTE — Telephone Encounter (Signed)
Fax came though placed in Dr. Barbie Banner RED folder.

## 2018-03-25 ENCOUNTER — Ambulatory Visit (INDEPENDENT_AMBULATORY_CARE_PROVIDER_SITE_OTHER): Payer: BLUE CROSS/BLUE SHIELD

## 2018-03-25 VITALS — Temp 98.3°F

## 2018-03-25 DIAGNOSIS — M76821 Posterior tibial tendinitis, right leg: Secondary | ICD-10-CM | POA: Diagnosis not present

## 2018-03-25 DIAGNOSIS — Z9889 Other specified postprocedural states: Secondary | ICD-10-CM | POA: Diagnosis not present

## 2018-03-25 NOTE — Telephone Encounter (Signed)
Called and spoke with pt. Pt advised and voiced understanding.  

## 2018-03-29 NOTE — Progress Notes (Signed)
Patient is here today for today for follow-up appointment.  She had surgery on 03/09/2018, procedure performed was repair posterior tibial tendon secondary right tenolysis right Kidner advanced in the right.  She states today that she is having some sharp shooting pains in her foot at times but overall she is feeling pretty good  Hard cast was removed, noted well-healing surgical site.  Some mild swelling, but within normal limits for postoperative state.  All staples remained intact and incision is healing well no gapping noted.  No erythema, no redness, no drainage from surgical site.  No other signs and symptoms of infection.  Hard cast was applied below the knee, patient stated comfort with cast.  She was advised on signs and symptoms of DVT and to report symptoms immediately.  She is to follow-up next week for cast removal or sooner with any acute symptom changes.

## 2018-03-30 NOTE — Telephone Encounter (Signed)
Patient is calling and states that she received a call stating that she will need to come by the office and sign a medical release form. Patient states she will be by sometime today.

## 2018-04-01 ENCOUNTER — Ambulatory Visit (INDEPENDENT_AMBULATORY_CARE_PROVIDER_SITE_OTHER): Payer: BLUE CROSS/BLUE SHIELD | Admitting: Podiatry

## 2018-04-01 ENCOUNTER — Telehealth: Payer: Self-pay | Admitting: Podiatry

## 2018-04-01 DIAGNOSIS — M76821 Posterior tibial tendinitis, right leg: Secondary | ICD-10-CM

## 2018-04-01 MED ORDER — OXYCODONE-ACETAMINOPHEN 10-325 MG PO TABS: 1.0000 | ORAL_TABLET | ORAL | 0 refills | Status: DC | PRN

## 2018-04-01 NOTE — Telephone Encounter (Signed)
This is Marie Tran, a Software engineer with Marie Tran. [phone]. I need help filling this prescription for Marie Tran. I cannot fill it until I talk to you and she is here waiting. Please call me back at [phone]. Thank you.

## 2018-04-01 NOTE — Addendum Note (Signed)
Addended by: Roney Jaffe on: 04/01/2018 04:43 PM   Modules accepted: Orders

## 2018-04-01 NOTE — Progress Notes (Signed)
  Subjective:  Patient ID: Marie Tran, female    DOB: 16-Dec-1970,  MRN: [id number]  Chief Complaint  Patient presents with  . Routine Post Op    " my foot feels ok, I am ready to get out of this cast"    DOS: 03/09/18 Procedure: Right Kidner seizure with tenolysis and anchoring of PT tendon  47 y.o. female returns for post-op check. Denies N/V/F/Ch.  Not having much pain.  Ready to get out of the cast.  Objective:   General AA&O x3. Normal mood and affect.  Vascular Foot warm and well perfused.  Neurologic Gross sensation intact.  Dermatologic Skin healing well without signs of infection. Skin edges well coapted without signs of infection.  Orthopedic: Tenderness to palpation noted about the surgical site.   Assessment & Plan:  Patient was evaluated and treated and all questions answered.  S/p Right Kidner with tenolysis and anchoring of PT tendon -Progressing as expected post-operatively. -Staples: intact.  Every other staple removed today -Medications refilled: Percocet -Multilayer compression dressing applied today consisting of Unna boot, cast padding, Coban -Refer to PT.  Return in about 1 week (around 04/08/2018) for Post-op.  For remaining staple removal.

## 2018-04-07 ENCOUNTER — Ambulatory Visit (INDEPENDENT_AMBULATORY_CARE_PROVIDER_SITE_OTHER): Payer: BLUE CROSS/BLUE SHIELD | Admitting: Podiatry

## 2018-04-07 DIAGNOSIS — R600 Localized edema: Secondary | ICD-10-CM | POA: Diagnosis not present

## 2018-04-07 DIAGNOSIS — M76821 Posterior tibial tendinitis, right leg: Secondary | ICD-10-CM

## 2018-04-07 DIAGNOSIS — Z9889 Other specified postprocedural states: Secondary | ICD-10-CM

## 2018-04-07 MED ORDER — OXYCODONE-ACETAMINOPHEN 5-325 MG PO TABS: 1.0000 | ORAL_TABLET | ORAL | 0 refills | Status: DC | PRN

## 2018-04-07 NOTE — Progress Notes (Signed)
  Subjective:  Patient ID: Marie Tran, female    DOB: 01/10/1971,  MRN: [id number]  Chief Complaint  Patient presents with  . Routine Post [facility] 07.24.2019 Repair Post Tibial Tendon/Secondary Rt, Tenolysis Rt, Kidner Advanced Tendon Rt   DOS: 03/09/18 Procedure: Right Kidner seizure with tenolysis and anchoring of PT tendon  47 y.o. female returns for post-op check. Denies N/V/F/Ch.  Not having much pain. Doing well. Has not been putting pressure on the foot.  Objective:   General AA&O x3. Normal mood and affect.  Vascular Foot warm and well perfused.  Neurologic Gross sensation intact.  Dermatologic Skin healing well without signs of infection. Skin edges well coapted without signs of infection.  Orthopedic: Tenderness to palpation noted about the surgical site.   Assessment & Plan:  Patient was evaluated and treated and all questions answered.  S/p Right Kidner with tenolysis and anchoring of PT tendon -Progressing as expected post-operatively. -Staples: remaining staples removed today. -Medications refilled: Percocet -Multilayer compression dressing applied today consisting of Unna boot, cast padding, Coban -Continue PT -Continue NWB -Plan to start WB next week if strong enough with PROM and AROM exercises.  Return in about 2 weeks (around 04/21/2018) for Marie Tran patient.

## 2018-04-21 ENCOUNTER — Ambulatory Visit (INDEPENDENT_AMBULATORY_CARE_PROVIDER_SITE_OTHER): Payer: Self-pay | Admitting: Podiatry

## 2018-04-21 DIAGNOSIS — Z9889 Other specified postprocedural states: Secondary | ICD-10-CM

## 2018-04-21 MED ORDER — OXYCODONE-ACETAMINOPHEN 5-325 MG PO TABS: 1.0000 | ORAL_TABLET | ORAL | 0 refills | Status: DC | PRN

## 2018-04-21 NOTE — Progress Notes (Signed)
  Subjective:  Patient ID: Marie Tran, female    DOB: 04-15-1971,  MRN: [id number]  Chief Complaint  Patient presents with  . Routine Post [facility] 07.24.2019 Repair Post Tibial Tendon/Secondary Rt, Tenolysis Rt, Kidner Advanced Tendon Rt -Dr. March Rummage    DOS: 03/09/18 Procedure: Right Kidner seizure with tenolysis and anchoring of PT tendon  47 y.o. female returns for post-op check. Denies N/V/F/Ch. Reports fall since last visit. More swollen this time than previous.  Objective:   General AA&O x3. Normal mood and affect.  Vascular Foot warm and well perfused. Edema noted RLE.  Neurologic Gross sensation intact.  Dermatologic Skin healing well without signs of infection. Skin edges well coapted without signs of infection.  Orthopedic: Tenderness to palpation noted about the surgical site. 4/5 DF/PF/Inv/Ev   Assessment & Plan:  Patient was evaluated and treated and all questions answered.  S/p Right Kidner with tenolysis and anchoring of PT tendon -Progressing as expected post-operatively. -Incision well healed. -Medications refilled: Percocet -Continue PT -Continue NWB. Ok to [facility] in heel for transfers -Not strong enough for WB. Continue AROM and PROM until strong enough for WB.  Return in about 2 weeks (around 05/05/2018) for Post-op.

## 2018-04-30 ENCOUNTER — Other Ambulatory Visit: Payer: Self-pay | Admitting: Family Medicine

## 2018-05-03 NOTE — Telephone Encounter (Signed)
Dr. Fry please advise on refill. Thanks  

## 2018-05-05 ENCOUNTER — Encounter: Payer: Self-pay | Admitting: Podiatry

## 2018-05-05 ENCOUNTER — Ambulatory Visit (INDEPENDENT_AMBULATORY_CARE_PROVIDER_SITE_OTHER): Payer: BLUE CROSS/BLUE SHIELD | Admitting: Podiatry

## 2018-05-05 DIAGNOSIS — Z9889 Other specified postprocedural states: Secondary | ICD-10-CM

## 2018-05-05 MED ORDER — OXYCODONE-ACETAMINOPHEN 5-325 MG PO TABS: 1.0000 | ORAL_TABLET | ORAL | 0 refills | Status: DC | PRN

## 2018-05-05 NOTE — Progress Notes (Signed)
  Subjective:  Patient ID: Marie Tran, female    DOB: [birth year],  MRN: [id number]  Chief Complaint  Patient presents with  . Routine Post Op    right foot is doing ok somedays, somedays not, still taking Percocet,wearing compression sock   DOS: 03/09/18 Procedure: Right Kidner procedure with tenolysis and anchoring of PT tendon  47 y.o. female returns for post-op check. Denies N/V/F/Ch.  Doing well has some good days and bad days.  Not yer to complete weightbearing.  Still using a knee scooter.  Objective:   General AA&O x3. Normal mood and affect.  Vascular Foot warm and well perfused. Edema noted RLE.  Neurologic Gross sensation intact.  Dermatologic Skin well-healed with slight palpable scar tissue  Orthopedic: Tenderness to palpation noted about the surgical site. 4/5 DF/PF/Inv/Ev   Assessment & Plan:  Patient was evaluated and treated and all questions answered.  S/p Right Kidner with tenolysis and anchoring of PT tendon -Progressing as expected post-operatively. -Incision well healed. -Medications refilled: Percocet -Continue PT -Transition to weightbearing as tolerated in cam boot with assistive device.  Return in about 4 weeks (around 06/02/2018) for Post-op.

## 2018-05-17 ENCOUNTER — Other Ambulatory Visit: Payer: Self-pay | Admitting: Family Medicine

## 2018-05-17 NOTE — Telephone Encounter (Signed)
Requested Prescriptions  Pending Prescriptions Disp Refills   HYDROcodone-acetaminophen (NORCO) 10-325 MG tablet 120 tablet 0    Sig: Take 1 tablet by mouth every 6 (six) hours as needed for severe pain.     Not Delegated - Analgesics:  Opioid Agonist Combinations Failed - 05/17/2018 10:57 AM      Failed - This refill cannot be delegated      Failed - Urine Drug Screen completed in last 360 days.      Passed - Valid encounter within last 6 months    Recent Outpatient Visits          3 months ago Chronic narcotic use   [facility] [place], [place]   6 months ago Chronic narcotic use   [facility], Ishmael Holter, MD   8 months ago PTSD (post-traumatic stress disorder)   [facility], Ishmael Holter, [place]   8 months ago Narcotic drug use   [facility] at [place], [place]   10 months ago Chronic right-sided low back pain with sciatica, sciatica laterality unspecified   [facility] at [facility], Ishmael Holter, [place]

## 2018-05-17 NOTE — Telephone Encounter (Signed)
Copied from [place] [id number]. Topic: Quick Communication - Rx Refill/Question >> May 17, 2018 10:54 AM Reyne Dumas L wrote: Medication: HYDROcodone-acetaminophen (NORCO) 10-325 MG tablet  Has the patient contacted their pharmacy? Yes - states they need authorization from [place] (Agent: If no, request that the patient contact the pharmacy for the refill.) (Agent: If yes, when and what did the pharmacy advise?)  Preferred Pharmacy (with phone number or street name): [id number] [place], [place] - [address] [phone] (Phone) [phone] (Fax)  Agent: Please be advised that RX refills may take up to 3 business days. We ask that you follow-up with your pharmacy.

## 2018-05-18 NOTE — Telephone Encounter (Signed)
Dr. Sarajane Jews please advise. Thanks Last PMV was 01/2018

## 2018-05-18 NOTE — Telephone Encounter (Signed)
She needs a PMV 

## 2018-05-23 ENCOUNTER — Encounter: Payer: Self-pay | Admitting: Family Medicine

## 2018-05-23 ENCOUNTER — Ambulatory Visit: Payer: BLUE CROSS/BLUE SHIELD | Admitting: Family Medicine

## 2018-05-23 VITALS — BP 128/74 | HR 92 | Temp 98.9°F | Wt 260.0 lb

## 2018-05-23 DIAGNOSIS — H9191 Unspecified hearing loss, right ear: Secondary | ICD-10-CM | POA: Diagnosis not present

## 2018-05-23 DIAGNOSIS — H6123 Impacted cerumen, bilateral: Secondary | ICD-10-CM

## 2018-05-23 DIAGNOSIS — F1190 Opioid use, unspecified, uncomplicated: Secondary | ICD-10-CM | POA: Diagnosis not present

## 2018-05-23 DIAGNOSIS — G8929 Other chronic pain: Secondary | ICD-10-CM

## 2018-05-23 DIAGNOSIS — H9311 Tinnitus, right ear: Secondary | ICD-10-CM | POA: Diagnosis not present

## 2018-05-23 DIAGNOSIS — M5440 Lumbago with sciatica, unspecified side: Secondary | ICD-10-CM

## 2018-05-23 MED ORDER — HYDROCODONE-ACETAMINOPHEN 10-325 MG PO TABS: 1.0000 | ORAL_TABLET | Freq: Four times a day (QID) | ORAL | 0 refills | Status: DC | PRN

## 2018-05-23 NOTE — Progress Notes (Signed)
   Subjective:    Patient ID: Marie Tran, female    DOB: [birth year], 47 y.o.   MRN: [id number]  HPI Here for pain management. She is doing well with her back, but she is still recovering from right foot surgery. She is wearing a boot, but she has been able to start getting around on crutches and to bear a little weight on the foot. She also describes a month of decreased hearing in the right ear and hearing high pitched ringing in the right ear. No pain.  Indication for chronic opioid: low back pain  Medication and dose: norco 10-325  # pills per month: 120 Last UDS date: 08-23-17 Opioid Treatment Agreement signed (Y/N): 11-02-17 Opioid Treatment Agreement last reviewed with patient:  05-23-18 NCCSRS reviewed this encounter (include red flags):  05-23-18    Review of Systems  Constitutional: Negative.   HENT: Positive for hearing loss and tinnitus. Negative for congestion, ear discharge and ear pain.   Respiratory: Negative.   Cardiovascular: Negative.   Musculoskeletal: Positive for back pain.  Neurological: Negative.        Objective:   Physical Exam  Constitutional: She is oriented to person, place, and time. She appears well-developed and well-nourished.  HENT:  Nose: Nose normal.  Mouth/Throat: Oropharynx is clear and moist.  Both ear canals are full of cerumen   Eyes: Conjunctivae are normal.  Neck: No thyromegaly present.  Cardiovascular: Normal rate, regular rhythm, normal heart sounds and intact distal pulses.  Pulmonary/Chest: Effort normal and breath sounds normal.  Lymphadenopathy:    She has no cervical adenopathy.  Neurological: She is alert and oriented to person, place, and time.          Assessment & Plan:  Pain management, meds were refilled. For the cerumen impactions and tinnitus, we will refer her to ENT.  [name], MD

## 2018-06-02 ENCOUNTER — Ambulatory Visit (INDEPENDENT_AMBULATORY_CARE_PROVIDER_SITE_OTHER): Payer: BLUE CROSS/BLUE SHIELD | Admitting: Podiatry

## 2018-06-02 DIAGNOSIS — Z9889 Other specified postprocedural states: Secondary | ICD-10-CM

## 2018-06-02 NOTE — Patient Instructions (Signed)

## 2018-06-13 NOTE — Progress Notes (Signed)
  Subjective:  Patient ID: Marie Tran, female    DOB: [birth year],  MRN: [id number]  Chief Complaint  Patient presents with  . Routine Post [employer] 07.24.2019 Repair Post Tibial Tendon/Secondary Rt, Tenolysis Rt, Kidner Advanced Tendon Rt - doing better - PT transitioned her to regular shoes last week. Patient requests to see Marcie Bal today - FMLA extension was denied   DOS: 03/09/18 Procedure: Right Kidner procedure with tenolysis and anchoring of PT tendon  47 y.o. female returns for post-op check. Denies N/V/F/Ch. Above history confirmed with patient.  Objective:   General AA&O x3. Normal mood and affect.  Vascular Foot warm and well perfused. Edema noted RLE.  Neurologic Gross sensation intact.  Dermatologic Skin well-healed with slight palpable scar tissue  Orthopedic: No tenderness to palpation noted about the surgical site. 4/5 DF/PF/Inv/Ev   Assessment & Plan:  Patient was evaluated and treated and all questions answered.  S/p Right Kidner with tenolysis and anchoring of PT tendon -Progressing as expected post-operatively. -Incision well healed. -Medications refilled: Percocet -Continue PT -Continue WBAT in normal [place] to return to work once she is comfortable in her normal shoegear.  Return in about 4 weeks (around 06/30/2018) for Plantar fasciitis and tendonitis f/u.

## 2018-06-15 ENCOUNTER — Encounter: Payer: Self-pay | Admitting: Podiatry

## 2018-06-30 ENCOUNTER — Ambulatory Visit (INDEPENDENT_AMBULATORY_CARE_PROVIDER_SITE_OTHER): Payer: BLUE CROSS/BLUE SHIELD | Admitting: Podiatry

## 2018-06-30 DIAGNOSIS — M722 Plantar fascial fibromatosis: Secondary | ICD-10-CM | POA: Diagnosis not present

## 2018-06-30 DIAGNOSIS — M25374 Other instability, right foot: Secondary | ICD-10-CM | POA: Diagnosis not present

## 2018-06-30 DIAGNOSIS — M76821 Posterior tibial tendinitis, right leg: Secondary | ICD-10-CM

## 2018-06-30 NOTE — Progress Notes (Signed)
Subjective:  Patient ID: Marie Tran, female    DOB: [birth year],  MRN: [id number]  Chief Complaint  Patient presents with  . Plantar Fasciitis    right foot - great toe "pops"sometimes, still very stiff - she is back at work full time    47 y.o. female presents with the above complaint. States the right foot is doing much better. Having occasional swelling. Back at work full time, having some difficulty completing full shifts because of her heel pain. States the surgical area feels much better compared to pre-op. Still ices in the evening.  Complains of popping in the right great toe.   Review of Systems: Negative except as noted in the HPI. Denies N/V/F/Ch.  Past Medical History:  Diagnosis Date  . Breast lump    right breast, UOQ, intramammary lymph node, also left breast cyst   . Complication of anesthesia    hard to wake up after lap choli  . Fatty liver disease, nonalcoholic   . Hoarseness   . Hyperlipidemia   . Hypothyroidism   . Migraines   . Neck pain, chronic    sees Dr. Phylliss Bob  . Obesity   . Overactive bladder   . PONV (postoperative nausea and vomiting)    had to have scope patch last surgery  . Snores   . Wears dentures    top    Current Outpatient Medications:  .  atorvastatin (LIPITOR) 40 MG tablet, TAKE 1 TABLET BY MOUTH EVERY DAY, Disp: 90 tablet, Rfl: 0 .  cephALEXin (KEFLEX) 500 MG capsule, Take 1 capsule (500 mg total) by mouth 2 (two) times daily., Disp: 14 capsule, Rfl: 0 .  ferrous sulfate 325 (65 FE) MG tablet, Take 1 tablet (325 mg total) daily with breakfast by mouth., Disp: 90 tablet, Rfl: 3 .  fluconazole (DIFLUCAN) 150 MG tablet, TAKE 1 TABLET BY MOUTH AS ONE DOSE, Disp: , Rfl: 5 .  FLUoxetine (PROZAC) 10 MG capsule, Take 1 capsule (10 mg total) by mouth daily., Disp: 30 capsule, Rfl: 3 .  gabapentin (NEURONTIN) 100 MG capsule, TAKE 1 CAPSULE BY MOUTH TWICE A DAY, Disp: 60 capsule, Rfl: 4 .  [START ON 08/17/2018]  HYDROcodone-acetaminophen (NORCO) 10-325 MG tablet, Take 1 tablet by mouth every 6 (six) hours as needed for severe pain., Disp: 120 tablet, Rfl: 0 .  levothyroxine (SYNTHROID, LEVOTHROID) 200 MCG tablet, TAKE 1 TABLET BY MOUTH EVERY DAY BEFORE BREAKFAST, Disp: 90 tablet, Rfl: 0 .  LORazepam (ATIVAN) 1 MG tablet, Take 1 tablet (1 mg total) by mouth every 8 (eight) hours as needed for anxiety., Disp: 60 tablet, Rfl: 0 .  meloxicam (MOBIC) 15 MG tablet, TAKE 1 TABLET BY MOUTH EVERY DAY, Disp: 30 tablet, Rfl: 0 .  Multiple Vitamins-Minerals (MULTIVITAMIN WITH MINERALS) tablet, Take 1 tablet by mouth daily., Disp: , Rfl:  .  omeprazole (PRILOSEC) 40 MG capsule, TAKE 1 CAPSULE (40 MG TOTAL) BY MOUTH EVERY EVENING., Disp: 90 capsule, Rfl: 2 .  ondansetron (ZOFRAN) 4 MG tablet, Take 1 tablet (4 mg total) by mouth every 8 (eight) hours as needed for nausea or vomiting., Disp: 20 tablet, Rfl: 0 .  oxyCODONE-acetaminophen (PERCOCET) 5-325 MG tablet, Take 1 tablet by mouth every 4 (four) hours as needed for severe pain., Disp: 30 tablet, Rfl: 0 .  phentermine (ADIPEX-P) 37.5 MG tablet, TAKE 1 TABLET BY MOUTH EVERY MORNING, Disp: 90 tablet, Rfl: 1 .  SUMAtriptan (IMITREX) 100 MG tablet, Take 1 tablet (100 mg total) as needed  by mouth for migraine. May repeat in 2 hours if headache persists or recurs., Disp: 10 tablet, Rfl: 5 .  traMADol (ULTRAM) 50 MG tablet, , Disp: , Rfl:  .  triamcinolone ointment (KENALOG) 0.5 %, Apply pea sized amount to affected area daily., Disp: 30 g, Rfl: 0  Social History   Tobacco Use  Smoking Status Former Smoker  . Last attempt to quit: 09/08/1988  . Years since quitting: 29.8  Smokeless Tobacco Never Used    No Known Allergies Objective:  There were no vitals filed for this visit. There is no height or weight on file to calculate BMI. Constitutional Well developed. Well nourished.  Vascular Dorsalis pedis pulses palpable bilaterally. Posterior tibial pulses palpable  bilaterally. Capillary refill normal to all digits.  No cyanosis or clubbing noted. Pedal hair growth normal.  Neurologic Normal speech. Oriented to person, place, and time. Epicritic sensation to light touch grossly present bilaterally.  Dermatologic Nails well groomed and normal in appearance. No open wounds. No skin lesions.  Orthopedic: Normal joint ROM without pain or crepitus bilaterally. No visible deformities. POP R medial calc tuber Slight crepitus R 1st MPJ but still with good motion.   Radiographs: None today Assessment:   1. Posterior tibial tendonitis of right leg   2. Plantar fasciitis   3. Instability of joint of toe of right foot    Plan:  Patient was evaluated and treated and all questions answered.  Plantar Fasciitis -Improving -Continue stretching -Should worsen will consider injection  PT Tendonitis -Improved s/p surgery, pleased with results.  Hallux Instability R -Will monitor.  Return in about 6 weeks (around 08/11/2018) for tendonitis, plantar fasciitis right.

## 2018-08-04 ENCOUNTER — Ambulatory Visit: Payer: BLUE CROSS/BLUE SHIELD | Admitting: Podiatry

## 2018-08-11 ENCOUNTER — Ambulatory Visit: Payer: BLUE CROSS/BLUE SHIELD | Admitting: Podiatry

## 2018-09-09 ENCOUNTER — Encounter: Payer: Self-pay | Admitting: Family Medicine

## 2018-09-09 ENCOUNTER — Ambulatory Visit (INDEPENDENT_AMBULATORY_CARE_PROVIDER_SITE_OTHER): Payer: Self-pay | Admitting: Family Medicine

## 2018-09-09 VITALS — BP 124/88 | HR 89 | Temp 98.4°F | Wt 262.5 lb

## 2018-09-09 DIAGNOSIS — B001 Herpesviral vesicular dermatitis: Secondary | ICD-10-CM

## 2018-09-09 MED ORDER — VALACYCLOVIR HCL 1 G PO TABS: 1000.0000 mg | ORAL_TABLET | Freq: Two times a day (BID) | ORAL | 5 refills | Status: DC

## 2018-09-09 NOTE — Progress Notes (Signed)
   Subjective:    Patient ID: Marie Tran, female    DOB: 05-04-1971, 48 y.o.   MRN: [id number]  HPI Here for one week of painful blisters around the lips.  She has outbreaks of fever blisters at times but not usually this bad.    Review of Systems  Constitutional: Negative.   Respiratory: Negative.   Cardiovascular: Negative.   Skin: Positive for rash.  Neurological: Negative.        Objective:   Physical Exam Constitutional:      Appearance: Normal appearance.  HENT:     Right Ear: Tympanic membrane and ear canal normal.     Left Ear: Tympanic membrane and ear canal normal.     Nose: Nose normal.     Mouth/Throat:     Comments: Both lips have numerous red vesicles  Eyes:     Conjunctiva/sclera: Conjunctivae normal.  Pulmonary:     Effort: Pulmonary effort is normal.     Breath sounds: Normal breath sounds.  Lymphadenopathy:     Cervical: No cervical adenopathy.  Neurological:     Mental Status: She is alert.           Assessment & Plan:  Fever blisters, treat with Valtrex.  [name], MD

## 2018-09-15 ENCOUNTER — Telehealth: Payer: Self-pay

## 2018-09-15 ENCOUNTER — Encounter: Payer: Self-pay | Admitting: Family Medicine

## 2018-09-15 ENCOUNTER — Ambulatory Visit (INDEPENDENT_AMBULATORY_CARE_PROVIDER_SITE_OTHER): Payer: Self-pay | Admitting: Family Medicine

## 2018-09-15 VITALS — BP 130/88 | HR 88 | Temp 98.7°F | Wt 262.4 lb

## 2018-09-15 DIAGNOSIS — M5440 Lumbago with sciatica, unspecified side: Secondary | ICD-10-CM

## 2018-09-15 DIAGNOSIS — G8929 Other chronic pain: Secondary | ICD-10-CM

## 2018-09-15 DIAGNOSIS — F1190 Opioid use, unspecified, uncomplicated: Secondary | ICD-10-CM

## 2018-09-15 MED ORDER — HYDROCODONE-ACETAMINOPHEN 10-325 MG PO TABS: 1.0000 | ORAL_TABLET | Freq: Four times a day (QID) | ORAL | 0 refills | Status: DC | PRN

## 2018-09-15 NOTE — Telephone Encounter (Signed)
[facility]  and it would have to be [facility] to [facility] down the road is what she told [facility] agent. Please advise

## 2018-09-15 NOTE — Progress Notes (Signed)
   Subjective:    Patient ID: Marie Tran, female    DOB: 07-25-1971, 48 y.o.   MRN: [id number]  HPI Here for pain management. She is doing well.  Indication for chronic opioid: low back pain  Medication and dose: Norco 10-325  # pills per month: 120 Last UDS date: 09-15-18 Opioid Treatment Agreement signed (Y/N): 09-15-18 Opioid Treatment Agreement last reviewed with patient:  09-15-18 NCCSRS reviewed this encounter (include red flags):  09-15-18    Review of Systems  Constitutional: Negative.   Respiratory: Negative.   Cardiovascular: Negative.   Musculoskeletal: Positive for back pain.  Neurological: Negative.        Objective:   Physical Exam Constitutional:      Appearance: Normal appearance.  Cardiovascular:     Rate and Rhythm: Normal rate and regular rhythm.     Pulses: Normal pulses.     Heart sounds: Normal heart sounds.  Pulmonary:     Effort: Pulmonary effort is normal.     Breath sounds: Normal breath sounds.  Neurological:     General: No focal deficit present.     Mental Status: She is alert and oriented to person, place, and time.           Assessment & Plan:  Pain management, meds were refilled.  [name], MD

## 2018-09-15 NOTE — Telephone Encounter (Signed)
Copied from [place]. Topic: General - Other >> Sep 15, 2018  2:00 PM Leward Quan A wrote: Reason for CRM: Patient called to say that Rx for HYDROcodone-acetaminophen ([place]) 10-325 MG tablet not available at pharmacy sent to but is at alternative [facility], [address] AT [place] [phone] (Phone) [phone] (Fax)

## 2018-09-15 NOTE — Addendum Note (Signed)
Addended by: Elmer Picker on: 09/15/2018 09:35 AM   Modules accepted: Orders

## 2018-09-16 MED ORDER — HYDROCODONE-ACETAMINOPHEN 10-325 MG PO TABS: 1.0000 | ORAL_TABLET | Freq: Four times a day (QID) | ORAL | 0 refills | Status: AC | PRN

## 2018-09-16 NOTE — Telephone Encounter (Signed)
Dr. Fry please advise. Thanks  

## 2018-09-16 NOTE — Telephone Encounter (Signed)
I sent a one month supply to [facility]

## 2018-09-16 NOTE — Telephone Encounter (Signed)
Called and spoke with pt and she is aware of [facility] the medication.

## 2018-09-19 LAB — PAIN MGMT, PROFILE 8 W/CONF, U
6 Acetylmorphine: NEGATIVE ng/mL (ref <10)
Alcohol Metabolites: NEGATIVE ng/mL (ref <500)
Amphetamines: NEGATIVE ng/mL (ref <500)
Benzodiazepines: NEGATIVE ng/mL (ref <100)
Buprenorphine, Urine: NEGATIVE ng/mL (ref <5)
Cocaine Metabolite: NEGATIVE ng/mL (ref <150)
Codeine: NEGATIVE ng/mL (ref <50)
Creatinine: 131.6 mg/dL
Hydrocodone: NEGATIVE ng/mL (ref <50)
Hydromorphone: NEGATIVE ng/mL (ref <50)
MDMA: NEGATIVE ng/mL (ref <500)
Marijuana Metabolite: NEGATIVE ng/mL (ref <20)
Morphine: NEGATIVE ng/mL (ref <50)
Norhydrocodone: 64 ng/mL — ABNORMAL HIGH (ref <50)
Opiates: POSITIVE ng/mL — AB (ref <100)
Oxidant: NEGATIVE ug/mL (ref <200)
Oxycodone: NEGATIVE ng/mL (ref <100)
pH: 6.95 (ref 4.5–9.0)

## 2018-11-28 ENCOUNTER — Ambulatory Visit (INDEPENDENT_AMBULATORY_CARE_PROVIDER_SITE_OTHER): Payer: Self-pay | Admitting: Family Medicine

## 2018-11-28 ENCOUNTER — Other Ambulatory Visit: Payer: Self-pay

## 2018-11-28 ENCOUNTER — Encounter: Payer: Self-pay | Admitting: Family Medicine

## 2018-11-28 DIAGNOSIS — M26609 Unspecified temporomandibular joint disorder, unspecified side: Secondary | ICD-10-CM

## 2018-11-28 DIAGNOSIS — M26659 Arthropathy of unspecified temporomandibular joint: Secondary | ICD-10-CM

## 2018-11-28 NOTE — Progress Notes (Signed)
Subjective:    Patient ID: Marie Tran, female    DOB: 19-Jan-1971, 48 y.o.   MRN: [id number]  HPI Virtual Visit via Video Note  I connected with the patient on 11/28/18 at  3:15 PM EDT by a video enabled telemedicine application and verified that I am speaking with the correct person using two identifiers.  Location patient: home Location provider:work or home office Persons participating in the virtual visit: patient, provider  I discussed the limitations of evaluation and management by telemedicine and the availability of in person appointments. The patient expressed understanding and agreed to proceed.   HPI: She complains about pain in both ears that started yesterday morning. She woke up with the pain and then it settles down. Then last night during dinner the pain returned, and now today she feels fine again. She does relate that chewing last night was a bit painful. No sinus congestion or ST or fever or cough. No chest pain or SOB. When I ask her to show me where it hurt, she points to both TMJ areas.    ROS: See pertinent positives and negatives per HPI.  Past Medical History:  Diagnosis Date  . Breast lump    right breast, UOQ, intramammary lymph node, also left breast cyst   . Complication of anesthesia    hard to wake up after lap choli  . Fatty liver disease, nonalcoholic   . Hoarseness   . Hyperlipidemia   . Hypothyroidism   . Migraines   . Neck pain, chronic    sees Dr. Phylliss Bob  . Obesity   . Overactive bladder   . PONV (postoperative nausea and vomiting)    had to have scope patch last surgery  . Snores   . Wears dentures    top    Past Surgical History:  Procedure Laterality Date  . CERVICAL DISC SURGERY  3/14   at [facility] per Dr. Lynann Bologna   . CHOLECYSTECTOMY  2009  . ROTATOR CUFF REPAIR W/ DISTAL CLAVICLE EXCISION Right 09-05-13   per Dr. Tamera Punt   . TUBAL LIGATION      Family History  Problem Relation Age of Onset  . Cancer Mother         breast  . Colon polyps Mother   . Bladder Cancer Father   . Leukemia Father   . Colon polyps Father   . Hypertension Other   . Cancer Other        ovarian,uterine,breast  . Stroke Other        female 1st degree relative <50  . Cancer Brother        brain  . Brain cancer Brother      Current Outpatient Medications:  .  ferrous sulfate 325 (65 FE) MG tablet, Take 1 tablet (325 mg total) daily with breakfast by mouth., Disp: 90 tablet, Rfl: 3 .  gabapentin (NEURONTIN) 100 MG capsule, TAKE 1 CAPSULE BY MOUTH TWICE A DAY, Disp: 60 capsule, Rfl: 4 .  levothyroxine (SYNTHROID, LEVOTHROID) 200 MCG tablet, TAKE 1 TABLET BY MOUTH EVERY DAY BEFORE BREAKFAST, Disp: 90 tablet, Rfl: 0 .  phentermine (ADIPEX-P) 37.5 MG tablet, TAKE 1 TABLET BY MOUTH EVERY MORNING, Disp: 90 tablet, Rfl: 1 .  SUMAtriptan (IMITREX) 100 MG tablet, Take 1 tablet (100 mg total) as needed by mouth for migraine. May repeat in 2 hours if headache persists or recurs., Disp: 10 tablet, Rfl: 5 .  triamcinolone ointment (KENALOG) 0.5 %, Apply pea sized amount to  affected area daily., Disp: 30 g, Rfl: 0 .  valACYclovir (VALTREX) 1000 MG tablet, Take 1 tablet (1,000 mg total) by mouth 2 (two) times daily., Disp: 10 tablet, Rfl: 5  EXAM:  VITALS per patient if applicable:  GENERAL: alert, oriented, appears well and in no acute distress  HEENT: atraumatic, conjunttiva clear, no obvious abnormalities on inspection of external nose and ears  NECK: normal movements of the head and neck  LUNGS: on inspection no signs of respiratory distress, breathing rate appears normal, no obvious gross SOB, gasping or wheezing  CV: no obvious cyanosis  MS: moves all visible extremities without noticeable abnormality  PSYCH/NEURO: pleasant and cooperative, no obvious depression or anxiety, speech and thought processing grossly intact  ASSESSMENT AND PLAN: This is TMJ pain. I asked her to avoid as much chewing as possible for a week or  so, and to eat soft foods. Take 2 Aleve tablets BID for a week or two. Recheck prn.  [name], MD  Discussed the following assessment and plan:  No diagnosis found.     I discussed the assessment and treatment plan with the patient. The patient was provided an opportunity to ask questions and all were answered. The patient agreed with the plan and demonstrated an understanding of the instructions.   The patient was advised to call back or seek an in-person evaluation if the symptoms worsen or if the condition fails to improve as anticipated.     Review of Systems     Objective:   Physical Exam        Assessment & Plan:

## 2018-12-16 ENCOUNTER — Telehealth: Payer: Self-pay | Admitting: Family Medicine

## 2018-12-16 NOTE — Telephone Encounter (Signed)
Patient returning call to [facility], requesting a call back.

## 2018-12-16 NOTE — Telephone Encounter (Signed)
Copied from [place] [id number]. Topic: Quick Communication - Rx Refill/Question >> Dec 16, 2018  8:31 AM Alanda Slim E wrote: Medication: HYDROcodone-acetaminophen (NORCO) 10-325 MG tablet  Has the patient contacted their pharmacy? Yes - [facility] and the Pt would [facility] that [facility] not [facility] it/ please advise Pt when sent  Preferred Pharmacy (with phone number or street name): [facility] [place], [place] [phone] (Phone) [phone] (Fax)    Agent: Please be advised that RX refills may take up to 3 business days. We ask that you follow-up with your pharmacy.

## 2018-12-16 NOTE — Telephone Encounter (Signed)
Clinic RN attempted to contact patient. No answer. LVM for patient to return call.  °

## 2018-12-16 NOTE — Telephone Encounter (Signed)
Patient scheduled for Tuesday 5/5 for PMV per Dr. Sarajane Jews.

## 2018-12-16 NOTE — Telephone Encounter (Signed)
She needs a PMV for this  

## 2018-12-20 ENCOUNTER — Ambulatory Visit (INDEPENDENT_AMBULATORY_CARE_PROVIDER_SITE_OTHER): Payer: Self-pay | Admitting: Family Medicine

## 2018-12-20 ENCOUNTER — Other Ambulatory Visit: Payer: Self-pay

## 2018-12-20 ENCOUNTER — Encounter: Payer: Self-pay | Admitting: Family Medicine

## 2018-12-20 DIAGNOSIS — G8929 Other chronic pain: Secondary | ICD-10-CM

## 2018-12-20 DIAGNOSIS — M5440 Lumbago with sciatica, unspecified side: Secondary | ICD-10-CM

## 2018-12-20 DIAGNOSIS — F1190 Opioid use, unspecified, uncomplicated: Secondary | ICD-10-CM

## 2018-12-20 MED ORDER — HYDROCODONE-ACETAMINOPHEN 10-325 MG PO TABS: 1.0000 | ORAL_TABLET | Freq: Four times a day (QID) | ORAL | 0 refills | Status: DC | PRN

## 2018-12-20 MED ORDER — HYDROCODONE-ACETAMINOPHEN 10-325 MG PO TABS: 1.0000 | ORAL_TABLET | Freq: Four times a day (QID) | ORAL | 0 refills | Status: AC | PRN

## 2018-12-20 NOTE — Progress Notes (Signed)
Subjective:    Patient ID: Marie Tran, female    DOB: 30-Sep-1970, 48 y.o.   MRN: [id number]  HPI Virtual Visit via Video Note  I connected with the patient on 12/20/18 at  9:30 AM EDT by a video enabled telemedicine application and verified that I am speaking with the correct person using two identifiers.  Location patient: home Location provider:work or home office Persons participating in the virtual visit: patient, provider  I discussed the limitations of evaluation and management by telemedicine and the availability of in person appointments. The patient expressed understanding and agreed to proceed.   HPI: Here for pain management. Her back pain has been stable, but lately she has had more pain in both hips. She has pain sittingon the floor cross legged and she has pain going up or down steps. Sometimes the pain seems to be isolated in the hip joints and sometimes it seems to shoot down into the upper legs.  Indication for chronic opioid: low back pain Medication and dose: Norco 10-325 # pills per month: 120 Last UDS date: 09-15-18 Opioid Treatment Agreement signed (Y/N): 10-10-18 Opioid Treatment Agreement last reviewed with patient:  12-20-18 NCCSRS reviewed this encounter (include red flags):  12-20-18    ROS: See pertinent positives and negatives per HPI.  Past Medical History:  Diagnosis Date  . Breast lump    right breast, UOQ, intramammary lymph node, also left breast cyst   . Complication of anesthesia    hard to wake up after lap choli  . Fatty liver disease, nonalcoholic   . Hoarseness   . Hyperlipidemia   . Hypothyroidism   . Migraines   . Neck pain, chronic    sees Dr. Phylliss Bob  . Obesity   . Overactive bladder   . PONV (postoperative nausea and vomiting)    had to have scope patch last surgery  . Snores   . Wears dentures    top    Past Surgical History:  Procedure Laterality Date  . CERVICAL DISC SURGERY  3/14   at C5-C6 per Dr.  Lynann Bologna   . CHOLECYSTECTOMY  2009  . ROTATOR CUFF REPAIR W/ DISTAL CLAVICLE EXCISION Right 09-05-13   per Dr. Tamera Punt   . TUBAL LIGATION      Family History  Problem Relation Age of Onset  . Cancer Mother        breast  . Colon polyps Mother   . Bladder Cancer Father   . Leukemia Father   . Colon polyps Father   . Hypertension Other   . Cancer Other        ovarian,uterine,breast  . Stroke Other        female 1st degree relative <50  . Cancer Brother        brain  . Brain cancer Brother      Current Outpatient Medications:  .  ferrous sulfate 325 (65 FE) MG tablet, Take 1 tablet (325 mg total) daily with breakfast by mouth., Disp: 90 tablet, Rfl: 3 .  gabapentin (NEURONTIN) 100 MG capsule, TAKE 1 CAPSULE BY MOUTH TWICE A DAY, Disp: 60 capsule, Rfl: 4 .  levothyroxine (SYNTHROID, LEVOTHROID) 200 MCG tablet, TAKE 1 TABLET BY MOUTH EVERY DAY BEFORE BREAKFAST, Disp: 90 tablet, Rfl: 0 .  phentermine (ADIPEX-P) 37.5 MG tablet, TAKE 1 TABLET BY MOUTH EVERY MORNING, Disp: 90 tablet, Rfl: 1 .  SUMAtriptan (IMITREX) 100 MG tablet, Take 1 tablet (100 mg total) as needed by mouth for migraine.  May repeat in 2 hours if headache persists or recurs., Disp: 10 tablet, Rfl: 5 .  triamcinolone ointment (KENALOG) 0.5 %, Apply pea sized amount to affected area daily., Disp: 30 g, Rfl: 0 .  valACYclovir (VALTREX) 1000 MG tablet, Take 1 tablet (1,000 mg total) by mouth 2 (two) times daily., Disp: 10 tablet, Rfl: 5 .  HYDROcodone-acetaminophen (NORCO) 10-325 MG tablet, TAKE 1 TABLET BY MOUTH EVERY 6 HOURS AS NEEDED FOR UP TO 30 DAYS FOR SEVERE PAIN., Disp: , Rfl:   EXAM:  VITALS per patient if applicable:  GENERAL: alert, oriented, appears well and in no acute distress  HEENT: atraumatic, conjunttiva clear, no obvious abnormalities on inspection of external nose and ears  NECK: normal movements of the head and neck  LUNGS: on inspection no signs of respiratory distress, breathing rate appears  normal, no obvious gross SOB, gasping or wheezing  CV: no obvious cyanosis  MS: moves all visible extremities without noticeable abnormality  PSYCH/NEURO: pleasant and cooperative, no obvious depression or anxiety, speech and thought processing grossly intact  ASSESSMENT AND PLAN: Pain management, the Norco was refilled. We will have her schedule an in person visit here later this week so I can examine her hips and get some Xrays of the hips. We need to determine if the pain is in the hip joints or if it is sciatic pain from the spine.  [name], MD  Discussed the following assessment and plan:  No diagnosis found.     I discussed the assessment and treatment plan with the patient. The patient was provided an opportunity to ask questions and all were answered. The patient agreed with the plan and demonstrated an understanding of the instructions.   The patient was advised to call back or seek an in-person evaluation if the symptoms worsen or if the condition fails to improve as anticipated.     Review of Systems     Objective:   Physical Exam        Assessment & Plan:

## 2018-12-21 ENCOUNTER — Telehealth: Payer: Self-pay

## 2018-12-21 NOTE — Telephone Encounter (Signed)
PA for HYDROcodone-acetaminophen (NORCO) 10-325 MG tablet has been sent to cover my meds.  PA has been approved.

## 2018-12-22 ENCOUNTER — Telehealth: Payer: Self-pay

## 2018-12-22 NOTE — Telephone Encounter (Signed)
PA for HYDROcodone-acetaminophen (NORCO) 10-325 MG tablet has been sent to cover my meds. PA was approved.

## 2018-12-26 ENCOUNTER — Ambulatory Visit: Payer: Self-pay | Admitting: Family Medicine

## 2018-12-28 ENCOUNTER — Encounter: Payer: Self-pay | Admitting: Family Medicine

## 2018-12-28 ENCOUNTER — Ambulatory Visit (INDEPENDENT_AMBULATORY_CARE_PROVIDER_SITE_OTHER): Payer: BLUE CROSS/BLUE SHIELD

## 2018-12-28 ENCOUNTER — Other Ambulatory Visit: Payer: Self-pay

## 2018-12-28 ENCOUNTER — Ambulatory Visit: Payer: BLUE CROSS/BLUE SHIELD | Admitting: Family Medicine

## 2018-12-28 VITALS — BP 118/80 | HR 80 | Temp 98.3°F | Wt 259.0 lb

## 2018-12-28 DIAGNOSIS — M25551 Pain in right hip: Secondary | ICD-10-CM

## 2018-12-28 DIAGNOSIS — M25552 Pain in left hip: Secondary | ICD-10-CM

## 2018-12-28 NOTE — Progress Notes (Signed)
   Subjective:    Patient ID: Marie Tran, female    DOB: [birth year], 48 y.o.   MRN: [id number]  HPI Here to check pain in both hips. The right hip started bothering her about a year ago, and now the left hip hurts just as badly. She has known disc disease in the lower spine, but the hips pains are somewhat different in nature. She has pain with going up or down steps and pain with sitting and crossing her legs. Sometimes this pain radiates into the upper thighs but no further. No numbness or tingling in the legs, no weakness. She uses Hydrocodone and Gabapentin daily.    Review of Systems  Constitutional: Negative.   Respiratory: Negative.   Cardiovascular: Negative.   Musculoskeletal: Positive for arthralgias and back pain.       Objective:   Physical Exam Constitutional:      General: She is not in acute distress.    Appearance: Normal appearance.  Cardiovascular:     Rate and Rhythm: Normal rate and regular rhythm.     Pulses: Normal pulses.     Heart sounds: Normal heart sounds.  Pulmonary:     Effort: Pulmonary effort is normal.     Breath sounds: Normal breath sounds.  Musculoskeletal:     Comments: She is tender over the lower back, both over the lumbar spine and to the right and the left of the lower spine. ROM of the spine is full. Negative SLR. She does have some pain and some limited ROM of both hips to internal/external rotation   Xrays of both hips are normal.   Neurological:     Mental Status: She is alert.           Assessment & Plan:  Her hip pain is likely the result of bursitis and not arthritis. Some of the pain is radiating from the spine also. She may benefit from steroid injections, so we will refer her to see Orthopedics again.  [name], MD

## 2019-02-03 ENCOUNTER — Other Ambulatory Visit: Payer: Self-pay | Admitting: Family Medicine

## 2019-02-03 DIAGNOSIS — Z1231 Encounter for screening mammogram for malignant neoplasm of breast: Secondary | ICD-10-CM

## 2019-02-10 ENCOUNTER — Telehealth: Payer: Self-pay | Admitting: Family Medicine

## 2019-02-10 MED ORDER — LEVOTHYROXINE SODIUM 200 MCG PO TABS: ORAL_TABLET | ORAL | 1 refills | Status: DC

## 2019-02-10 NOTE — Telephone Encounter (Signed)
Medication Refill - Medication: levothyroxine (SYNTHROID, LEVOTHROID) 200 MCG tablet,  phentermine (ADIPEX-P) 37.5 MG tablet  Patient is completely out of Thyroid medication.     Has the patient contacted their pharmacy? Yes.   (Agent: If no, request that the patient contact the pharmacy for the refill.) (Agent: If yes, when and what did the pharmacy advise?)  Preferred Pharmacy (with phone number or street name):  [id number] - [place], [address] [phone] (Phone) [phone] (Fax)     Agent: Please be advised that RX refills may take up to 3 business days. We ask that you follow-up with your pharmacy.

## 2019-02-11 ENCOUNTER — Other Ambulatory Visit: Payer: Self-pay | Admitting: Family Medicine

## 2019-03-16 ENCOUNTER — Ambulatory Visit: Payer: BLUE CROSS/BLUE SHIELD

## 2019-03-24 ENCOUNTER — Other Ambulatory Visit: Payer: Self-pay | Admitting: Family Medicine

## 2019-03-27 NOTE — Telephone Encounter (Signed)
Last filled 05/10/2018 Last OV 12/28/2018  Ok to fill?

## 2019-04-10 ENCOUNTER — Other Ambulatory Visit: Payer: Self-pay

## 2019-04-10 ENCOUNTER — Encounter: Payer: Self-pay | Admitting: Family Medicine

## 2019-04-10 ENCOUNTER — Telehealth (INDEPENDENT_AMBULATORY_CARE_PROVIDER_SITE_OTHER): Payer: BC Managed Care – PPO | Admitting: Family Medicine

## 2019-04-10 DIAGNOSIS — G8929 Other chronic pain: Secondary | ICD-10-CM | POA: Diagnosis not present

## 2019-04-10 DIAGNOSIS — M5440 Lumbago with sciatica, unspecified side: Secondary | ICD-10-CM | POA: Diagnosis not present

## 2019-04-10 DIAGNOSIS — F1190 Opioid use, unspecified, uncomplicated: Secondary | ICD-10-CM

## 2019-04-10 DIAGNOSIS — G5603 Carpal tunnel syndrome, bilateral upper limbs: Secondary | ICD-10-CM | POA: Diagnosis not present

## 2019-04-10 MED ORDER — HYDROCODONE-ACETAMINOPHEN 10-325 MG PO TABS: 1.0000 | ORAL_TABLET | Freq: Four times a day (QID) | ORAL | 0 refills | Status: AC | PRN

## 2019-04-10 MED ORDER — HYDROCODONE-ACETAMINOPHEN 10-325 MG PO TABS: 1.0000 | ORAL_TABLET | Freq: Four times a day (QID) | ORAL | 0 refills | Status: DC | PRN

## 2019-04-10 NOTE — Progress Notes (Signed)
Virtual Visit via Video Note  I connected with the patient on 04/10/19 at  9:45 AM EDT by a video enabled telemedicine application and verified that I am speaking with the correct person using two identifiers.  Location patient: home Location provider:work or home office Persons participating in the virtual visit: patient, provider  I discussed the limitations of evaluation and management by telemedicine and the availability of in person appointments. The patient expressed understanding and agreed to proceed.   HPI: Here for pain management. Her back pain is stable but her carpal tunnel syndrome is worse than ever, especially on the right side. She has pain and numbness in the hand, and she has a tendency to drop things. 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 Now she is ready for this.  Indication for chronic opioid: low back pain Medication and dose: Norco 10-325 # pills per month: 120 Last UDS date: 09-15-18 Opioid Treatment Agreement signed (Y/N): 10-10-18 Opioid Treatment Agreement last reviewed with patient:  04-10-19 NCCSRS reviewed this encounter (include red flags): Yes     ROS: See pertinent positives and negatives per HPI.  Past Medical History:  Diagnosis Date  . Breast lump    right breast, UOQ, intramammary lymph node, also left breast cyst   . Complication of anesthesia    hard to wake up after lap choli  . Fatty liver disease, nonalcoholic   . Hoarseness   . Hyperlipidemia   . Hypothyroidism   . Migraines   . Neck pain, chronic    sees Dr. Phylliss Bob  . Obesity   . Overactive bladder   . PONV (postoperative nausea and vomiting)    had to have scope patch last surgery  . Snores   . Wears dentures    top    Past Surgical History:  Procedure Laterality Date  . CERVICAL DISC SURGERY  3/14   at C5-C6 per Dr. Lynann Bologna   . CHOLECYSTECTOMY  2009  . ROTATOR CUFF REPAIR W/ DISTAL CLAVICLE  EXCISION Right 09-05-13   per Dr. Tamera Punt   . TUBAL LIGATION      Family History  Problem Relation Age of Onset  . Cancer Mother        breast  . Colon polyps Mother   . Bladder Cancer Father   . Leukemia Father   . Colon polyps Father   . Hypertension Other   . Cancer Other        ovarian,uterine,breast  . Stroke Other        female 1st degree relative <50  . Cancer Brother        brain  . Brain cancer Brother      Current Outpatient Medications:  .  ferrous sulfate 325 (65 FE) MG tablet, Take 1 tablet (325 mg total) daily with breakfast by mouth., Disp: 90 tablet, Rfl: 3 .  gabapentin (NEURONTIN) 100 MG capsule, TAKE 1 CAPSULE BY MOUTH TWICE A DAY, Disp: 60 capsule, Rfl: 4 .  [START ON 06/10/2019] HYDROcodone-acetaminophen (NORCO) 10-325 MG tablet, Take 1 tablet by mouth every 6 (six) hours as needed for moderate pain., Disp: 120 tablet, Rfl: 0 .  levothyroxine (SYNTHROID) 200 MCG tablet, TAKE 1 TABLET BY MOUTH EVERY DAY BEFORE BREAKFAST, Disp: 90 tablet, Rfl: 1 .  omeprazole (PRILOSEC) 40 MG capsule, TAKE 1 CAPSULE (40 MG TOTAL) BY MOUTH EVERY EVENING., Disp: 90 capsule, Rfl: 2 .  phentermine (ADIPEX-P)  37.5 MG tablet, TAKE 1 TABLET BY MOUTH EVERY DAY IN THE MORNING, Disp: 90 tablet, Rfl: 1 .  SUMAtriptan (IMITREX) 100 MG tablet, Take 1 tablet (100 mg total) as needed by mouth for migraine. May repeat in 2 hours if headache persists or recurs., Disp: 10 tablet, Rfl: 5 .  triamcinolone ointment (KENALOG) 0.5 %, Apply pea sized amount to affected area daily., Disp: 30 g, Rfl: 0 .  valACYclovir (VALTREX) 1000 MG tablet, Take 1 tablet (1,000 mg total) by mouth 2 (two) times daily., Disp: 10 tablet, Rfl: 5  EXAM:  VITALS per patient if applicable:  GENERAL: alert, oriented, appears well and in no acute distress  HEENT: atraumatic, conjunttiva clear, no obvious abnormalities on inspection of external nose and ears  NECK: normal movements of the head and neck  LUNGS: on  inspection no signs of respiratory distress, breathing rate appears normal, no obvious gross SOB, gasping or wheezing  CV: no obvious cyanosis  MS: moves all visible extremities without noticeable abnormality  PSYCH/NEURO: pleasant and cooperative, no obvious depression or anxiety, speech and thought processing grossly intact  ASSESSMENT AND PLAN: Pain  Management, meds were refilled. For the carpal tunnel, we will refer her to hand Surgery. [name], MD  Discussed the following assessment and plan:  Chronic right-sided low back pain with sciatica, sciatica laterality unspecified  Carpal tunnel syndrome, bilateral - Plan: Ambulatory referral to Hand Surgery     I discussed the assessment and treatment plan with the patient. The patient was provided an opportunity to ask questions and all were answered. The patient agreed with the plan and demonstrated an understanding of the instructions.   The patient was advised to call back or seek an in-person evaluation if the symptoms worsen or if the condition fails to improve as anticipated.

## 2019-04-11 ENCOUNTER — Encounter: Payer: Self-pay | Admitting: Family Medicine

## 2019-04-12 NOTE — Telephone Encounter (Signed)
PA has been sent to cover my meds.   Key: [id number] - PA Case ID: [id number]

## 2019-04-13 NOTE — Telephone Encounter (Signed)
Please advise. PA has been denied. Denial letter is in Dr. Barbie Banner green folder.

## 2019-04-13 NOTE — Telephone Encounter (Signed)
To satisfy the their requests please send them office notes of the past 2 PMV visits and have her sign a new contract so this is within the past 6 months (submit this as well)

## 2019-05-01 ENCOUNTER — Other Ambulatory Visit: Payer: Self-pay

## 2019-05-01 ENCOUNTER — Ambulatory Visit
Admission: RE | Admit: 2019-05-01 | Discharge: 2019-05-01 | Disposition: A | Discharge status: Home / Self Care | Payer: BC Managed Care – PPO | Source: Ambulatory Visit | Attending: Family Medicine | Admitting: Family Medicine

## 2019-05-01 DIAGNOSIS — Z1231 Encounter for screening mammogram for malignant neoplasm of breast: Secondary | ICD-10-CM

## 2019-07-10 ENCOUNTER — Other Ambulatory Visit: Payer: Self-pay

## 2019-07-11 ENCOUNTER — Ambulatory Visit: Payer: BC Managed Care – PPO | Admitting: Family Medicine

## 2019-07-11 ENCOUNTER — Telehealth: Payer: Self-pay | Admitting: Family Medicine

## 2019-07-11 ENCOUNTER — Encounter: Payer: Self-pay | Admitting: Family Medicine

## 2019-07-11 VITALS — BP 130/90 | HR 79 | Temp 98.2°F | Wt 245.0 lb

## 2019-07-11 DIAGNOSIS — G8929 Other chronic pain: Secondary | ICD-10-CM

## 2019-07-11 DIAGNOSIS — F1190 Opioid use, unspecified, uncomplicated: Secondary | ICD-10-CM | POA: Diagnosis not present

## 2019-07-11 DIAGNOSIS — M5440 Lumbago with sciatica, unspecified side: Secondary | ICD-10-CM

## 2019-07-11 MED ORDER — HYDROCODONE-ACETAMINOPHEN 10-325 MG PO TABS: 1.0000 | ORAL_TABLET | Freq: Four times a day (QID) | ORAL | 0 refills | Status: DC | PRN

## 2019-07-11 MED ORDER — LEVOTHYROXINE SODIUM 200 MCG PO TABS: ORAL_TABLET | ORAL | 3 refills | Status: DC

## 2019-07-11 MED ORDER — MELOXICAM 15 MG PO TABS: 15.0000 mg | ORAL_TABLET | Freq: Every day | ORAL | 3 refills | Status: DC

## 2019-07-11 MED ORDER — HYDROCODONE-ACETAMINOPHEN 10-325 MG PO TABS: 1.0000 | ORAL_TABLET | Freq: Four times a day (QID) | ORAL | 0 refills | Status: AC | PRN

## 2019-07-11 NOTE — Progress Notes (Signed)
   Subjective:    Patient ID: Marie Tran, female    DOB: September 07, 1970, 48 y.o.   MRN: [id number]  HPI Here for pain management. She has good days and bad days. When she works she is on her feet for hours at a time, and this makes her pain worse. She stopped taking Gabapentin because it was too sedating.  Indication for chronic opioid: low back pain Medication and dose: Norco 10-325  # pills per month: 120 Last UDS date: 09-15-18 Opioid Treatment Agreement signed (Y/N): 10-10-18 Opioid Treatment Agreement last reviewed with patient:  07-11-19 NCCSRS reviewed this encounter (include red flags): Yes    Review of Systems     Objective:   Physical Exam        Assessment & Plan:  Pain management, meds were refilled. We will add Meloxicam 15 mg daily.  [name], MD

## 2019-07-11 NOTE — Telephone Encounter (Signed)
[employer] called in to update provider. Rx for HYDROcodone-acetaminophen (NORCO) 10-325 MG tablet  Is needing a PA and also a quantity override  This can be completed through [employer] scripts at: [phone].

## 2019-07-11 NOTE — Patient Instructions (Signed)
Health Maintenance Due  Topic Date Due  . HIV Screening  05/03/1985  . TETANUS/TDAP  05/03/1989  . PAP SMEAR-Modifier  06/16/2018  . INFLUENZA VACCINE  03/18/2019    No flowsheet data found.

## 2019-07-12 NOTE — Telephone Encounter (Signed)
This encounter was created in error - please disregard.

## 2019-07-20 NOTE — Telephone Encounter (Signed)
See request °

## 2019-07-20 NOTE — Telephone Encounter (Signed)
Marie Tran, from [place], calling to check on the status of this PA. Please advise.

## 2019-07-21 ENCOUNTER — Encounter: Payer: Self-pay | Admitting: Family Medicine

## 2019-07-26 NOTE — Telephone Encounter (Signed)
Called [employer] but was on hold for 10 min will call back later.

## 2019-08-01 NOTE — Telephone Encounter (Signed)
Left message to return phone call.

## 2019-08-01 NOTE — Telephone Encounter (Signed)
Left message for pt to return phone call. Was trying to see if pt received medication.

## 2019-09-05 ENCOUNTER — Telehealth: Payer: Self-pay | Admitting: *Deleted

## 2019-09-05 NOTE — Telephone Encounter (Signed)
I told pt she would need to discuss with Human Resources and get the Workers' Compensation information to be referred to Dr. March Rummage. Pt states she spoke with HR and what would she do if they didn't refer to DR. Price and he is the only doctor she wants to see. I told pt she would benefit from discussing with a scheduler and may even be referred to an HR doctor and then referred to Dr. March Rummage. Pt states she will check.

## 2019-09-05 NOTE — Telephone Encounter (Signed)
Pt states she had foot surgery about 2 years ago, and she was at work Sunday and a shelf fell on the surgery foot and she wanted to know what to do.

## 2019-10-20 ENCOUNTER — Other Ambulatory Visit: Payer: Self-pay | Admitting: Family Medicine

## 2019-10-20 NOTE — Telephone Encounter (Signed)
Last filled 03/27/2019 Last OV 07/11/2019  Ok to fill?

## 2019-11-06 ENCOUNTER — Other Ambulatory Visit: Payer: Self-pay

## 2019-11-07 ENCOUNTER — Ambulatory Visit (INDEPENDENT_AMBULATORY_CARE_PROVIDER_SITE_OTHER): Payer: BC Managed Care – PPO | Admitting: Family Medicine

## 2019-11-07 ENCOUNTER — Ambulatory Visit: Payer: BC Managed Care – PPO | Admitting: Family Medicine

## 2019-11-07 ENCOUNTER — Encounter: Payer: Self-pay | Admitting: Family Medicine

## 2019-11-07 VITALS — BP 120/90 | HR 83 | Temp 97.8°F | Ht 65.0 in | Wt 246.0 lb

## 2019-11-07 DIAGNOSIS — G8929 Other chronic pain: Secondary | ICD-10-CM

## 2019-11-07 DIAGNOSIS — M5440 Lumbago with sciatica, unspecified side: Secondary | ICD-10-CM

## 2019-11-07 DIAGNOSIS — F1190 Opioid use, unspecified, uncomplicated: Secondary | ICD-10-CM

## 2019-11-07 MED ORDER — FLUCONAZOLE 150 MG PO TABS: 150.0000 mg | ORAL_TABLET | Freq: Once | ORAL | 5 refills | Status: AC

## 2019-11-07 MED ORDER — HYDROCODONE-ACETAMINOPHEN 10-325 MG PO TABS: 1.0000 | ORAL_TABLET | Freq: Four times a day (QID) | ORAL | 0 refills | Status: DC | PRN

## 2019-11-07 NOTE — Progress Notes (Signed)
   Subjective:    Patient ID: Marie Tran, female    DOB: 12-30-1970, 49 y.o.   MRN: [id number]  HPI Here for pain management. She is doing well.  Indication for chronic opioid: low back pain Medication and dose: Norco 10-325 # pills per month: 120 Last UDS date: 09-15-18 Opioid Treatment Agreement signed (Y/N): 10-10-18 Opioid Treatment Agreement last reviewed with patient:  11-07-19 NCCSRS reviewed this encounter (include red flags): Yes    Review of Systems     Objective:   Physical Exam        Assessment & Plan:  Pain management, meds were refilled.  [name], MD

## 2019-11-09 LAB — PAIN MGMT, PROFILE 8 W/CONF, U
6 Acetylmorphine: NEGATIVE ng/mL
Alcohol Metabolites: NEGATIVE ng/mL (ref <500)
Amphetamines: NEGATIVE ng/mL
Benzodiazepines: NEGATIVE ng/mL
Buprenorphine, Urine: NEGATIVE ng/mL
Cocaine Metabolite: NEGATIVE ng/mL
Codeine: NEGATIVE ng/mL
Creatinine: 87.8 mg/dL
Hydrocodone: 689 ng/mL
Hydromorphone: 90 ng/mL
MDMA: NEGATIVE ng/mL
Marijuana Metabolite: NEGATIVE ng/mL
Morphine: NEGATIVE ng/mL
Norhydrocodone: 820 ng/mL
Opiates: POSITIVE ng/mL
Oxidant: NEGATIVE ug/mL
Oxycodone: NEGATIVE ng/mL
pH: 5.6 (ref 4.5–9.0)

## 2019-11-28 ENCOUNTER — Encounter: Payer: Self-pay | Admitting: Family Medicine

## 2019-12-01 NOTE — Telephone Encounter (Signed)
BEBXATDA - PA Case ID: [id number]

## 2019-12-06 ENCOUNTER — Encounter: Payer: Self-pay | Admitting: Family Medicine

## 2019-12-06 NOTE — Telephone Encounter (Signed)
Please see mychart message from 11/28/2019

## 2020-01-30 ENCOUNTER — Ambulatory Visit: Payer: BC Managed Care – PPO | Admitting: Family Medicine

## 2020-01-30 ENCOUNTER — Telehealth: Payer: Self-pay

## 2020-01-30 ENCOUNTER — Encounter: Payer: Self-pay | Admitting: Family Medicine

## 2020-01-30 ENCOUNTER — Other Ambulatory Visit: Payer: Self-pay

## 2020-01-30 VITALS — BP 140/70 | HR 87 | Temp 97.8°F | Wt 249.8 lb

## 2020-01-30 DIAGNOSIS — M5440 Lumbago with sciatica, unspecified side: Secondary | ICD-10-CM

## 2020-01-30 DIAGNOSIS — G8929 Other chronic pain: Secondary | ICD-10-CM

## 2020-01-30 DIAGNOSIS — F1190 Opioid use, unspecified, uncomplicated: Secondary | ICD-10-CM

## 2020-01-30 MED ORDER — HYDROCODONE-ACETAMINOPHEN 10-325 MG PO TABS: 1.0000 | ORAL_TABLET | Freq: Four times a day (QID) | ORAL | 0 refills | Status: DC | PRN

## 2020-01-30 MED ORDER — HYDROCODONE-ACETAMINOPHEN 10-325 MG PO TABS: 1.0000 | ORAL_TABLET | Freq: Four times a day (QID) | ORAL | 0 refills | Status: AC | PRN

## 2020-01-30 NOTE — Telephone Encounter (Signed)
Patient was seen in the office and stated her Hydrocodone needs a PA.

## 2020-01-30 NOTE — Telephone Encounter (Signed)
PA has been sent to cover my meds. Patient is aware.  KeyGwendel Hanson - PA Case ID: [id number]

## 2020-01-30 NOTE — Progress Notes (Signed)
   Subjective:    Patient ID: Marie Tran, female    DOB: 01-02-1971, 49 y.o.   MRN: [id number]  HPI Here for pain management. She is doing well.  Indication for chronic opioid: low back pain Medication and dose: Norco 10-325 # pills per month: 120 Last UDS date: 11-07-19 Opioid Treatment Agreement signed (Y/N): 10-10-18 Opioid Treatment Agreement last reviewed with patient:  01-30-20 NCCSRS reviewed this encounter (include red flags):  01-30-20    Review of Systems     Objective:   Physical Exam        Assessment & Plan:  Pain management, meds were refilled.  [name], MD

## 2020-02-01 NOTE — Telephone Encounter (Signed)
Donnetta Simpers from [place] stated they faxed over a form requesting additional information and asked if we received it?    Phone: [phone] Case ID [id number] FAX: [phone]

## 2020-02-02 NOTE — Telephone Encounter (Signed)
Form has been received, completed and faxed back.

## 2020-03-11 ENCOUNTER — Telehealth: Payer: Self-pay | Admitting: Family Medicine

## 2020-03-11 NOTE — Telephone Encounter (Signed)
Left message for patient to call back  

## 2020-03-11 NOTE — Telephone Encounter (Signed)
Requesting a call back in regards to Prior Authorization about her limits on her Hydrocodone.

## 2020-03-13 NOTE — Telephone Encounter (Signed)
Spoke with the patient she stated that the insurance company told her that we only did the PA for 1 month. I explained to the patient that we have no control over how long they approve the medication for, this is completely up to the insurance company. Also informed the patient that I have received a PA form from her insurance company for this medication and will get this completed for her.

## 2020-03-18 NOTE — Telephone Encounter (Signed)
Form has been completed. Per the insurance company she needs a pain management contract signed within the last 6 months. Will call the patient so she can come in and sign this. Per Dr. Sarajane Jews she does not need an appointment.

## 2020-03-20 NOTE — Telephone Encounter (Signed)
Lori from [place] stated she spoke to [facility] about sumbiting PA for the Hydrocodone but have not gotten anything back and calling to see if it can be submitted so the pt can get the px.   Cecille Rubin can be reached at [phone]  Pharmacy dept: [phone] FAX back to: [phone]

## 2020-03-20 NOTE — Telephone Encounter (Signed)
Patient stopped by and signed the forms on Monday. Forms were completed and faxed back today along with the requested office notes.

## 2020-03-21 NOTE — Telephone Encounter (Signed)
Form has been refaxed.

## 2020-03-27 ENCOUNTER — Telehealth: Payer: Self-pay | Admitting: Family Medicine

## 2020-03-27 NOTE — Telephone Encounter (Signed)
Needing additional information for preauthorization for the limits on  HYDROcodone-acetaminophen (NORCO) 10-325 MG tablet   And the information was faxed over.  Any questions call her at [phone] [facility]

## 2020-03-27 NOTE — Telephone Encounter (Signed)
An additional form was received for this. The form has been completed with the requested information and faxed back.

## 2020-04-02 NOTE — Telephone Encounter (Signed)
See mychart encounter for further documentation.  

## 2020-08-02 ENCOUNTER — Other Ambulatory Visit: Payer: Self-pay | Admitting: Family Medicine

## 2020-08-16 ENCOUNTER — Encounter: Payer: Self-pay | Admitting: Family Medicine

## 2020-08-19 ENCOUNTER — Telehealth: Payer: Self-pay

## 2020-08-19 NOTE — Telephone Encounter (Signed)
Patient is scheduled for 08/20/2020 at 10:30 AM

## 2020-08-19 NOTE — Telephone Encounter (Signed)
Please schedule this patient an appointment ASAP due to blisters at back of throat.  [phone]

## 2020-08-20 ENCOUNTER — Encounter: Payer: Self-pay | Admitting: Family Medicine

## 2020-08-20 ENCOUNTER — Ambulatory Visit: Payer: BC Managed Care – PPO | Admitting: Family Medicine

## 2020-08-20 ENCOUNTER — Other Ambulatory Visit: Payer: Self-pay

## 2020-08-20 VITALS — BP 136/90 | HR 78 | Temp 98.0°F | Ht 65.0 in | Wt 245.4 lb

## 2020-08-20 DIAGNOSIS — J039 Acute tonsillitis, unspecified: Secondary | ICD-10-CM | POA: Diagnosis not present

## 2020-08-20 MED ORDER — CEPHALEXIN 500 MG PO CAPS: 500.0000 mg | ORAL_CAPSULE | Freq: Three times a day (TID) | ORAL | 0 refills | Status: AC

## 2020-08-20 MED ORDER — FLUCONAZOLE 150 MG PO TABS: 150.0000 mg | ORAL_TABLET | Freq: Once | ORAL | 5 refills | Status: AC

## 2020-08-20 NOTE — Progress Notes (Signed)
   Subjective:    Patient ID: Marie Tran, female    DOB: [birth year], 50 y.o.   MRN: [id number]  HPI Here for 5 days of a ST. No sinus congestion or cough or fever. First the left side of her throat was sore and then it moved to the right side. Using Ibuprofen and salt water gargles.    Review of Systems  Constitutional: Negative.   HENT: Positive for sore throat. Negative for congestion, ear pain, postnasal drip and sinus pressure.   Eyes: Negative.   Respiratory: Negative.        Objective:   Physical Exam Constitutional:      Appearance: Normal appearance. She is well-developed. She is not ill-appearing.  HENT:     Right Ear: Tympanic membrane, ear canal and external ear normal.     Left Ear: Tympanic membrane, ear canal and external ear normal.     Nose: Nose normal.     Mouth/Throat:     Pharynx: Oropharynx is clear.  Eyes:     Conjunctiva/sclera: Conjunctivae normal.  Neck:     Comments: Tender bilateral AC nodes  Pulmonary:     Effort: Pulmonary effort is normal.     Breath sounds: Normal breath sounds.  Neurological:     Mental Status: She is alert.           Assessment & Plan:  Tonsillitis, treat with Keflex.  Marie Crane, MD

## 2020-09-02 ENCOUNTER — Other Ambulatory Visit: Payer: Self-pay | Admitting: Family Medicine

## 2020-09-02 NOTE — Telephone Encounter (Signed)
Last office visit--08/20/2020  Last refill--07/11/2019---90 tabs with 3 refills

## 2020-09-09 ENCOUNTER — Other Ambulatory Visit: Payer: Self-pay

## 2020-09-10 ENCOUNTER — Encounter: Payer: Self-pay | Admitting: Family Medicine

## 2020-09-10 ENCOUNTER — Ambulatory Visit: Payer: BC Managed Care – PPO | Admitting: Family Medicine

## 2020-09-10 VITALS — BP 148/88 | HR 80 | Ht 65.0 in | Wt 239.0 lb

## 2020-09-10 DIAGNOSIS — M5440 Lumbago with sciatica, unspecified side: Secondary | ICD-10-CM | POA: Diagnosis not present

## 2020-09-10 DIAGNOSIS — F1190 Opioid use, unspecified, uncomplicated: Secondary | ICD-10-CM

## 2020-09-10 DIAGNOSIS — G8929 Other chronic pain: Secondary | ICD-10-CM

## 2020-09-10 MED ORDER — HYDROCODONE-ACETAMINOPHEN 10-325 MG PO TABS: 1.0000 | ORAL_TABLET | Freq: Four times a day (QID) | ORAL | 0 refills | Status: DC | PRN

## 2020-09-10 MED ORDER — HYDROCODONE-ACETAMINOPHEN 10-325 MG PO TABS: 1.0000 | ORAL_TABLET | Freq: Four times a day (QID) | ORAL | 0 refills | Status: AC | PRN

## 2020-09-11 ENCOUNTER — Encounter: Payer: Self-pay | Admitting: Family Medicine

## 2020-09-11 ENCOUNTER — Telehealth: Payer: Self-pay

## 2020-09-11 NOTE — Telephone Encounter (Addendum)
Prior authorization submitted for Hydrocodone.  Awaiting response will receive within 72 hours.  If [facility] has not replied to your request within 72 hours please contact [facility] at [phone].

## 2020-09-11 NOTE — Progress Notes (Signed)
   Subjective:    Patient ID: Marie Tran, female    DOB: October 24, 1970, 50 y.o.   MRN: [id number]  HPI Here for pain management, she is doing well. She is recovering from a recent foot surgery. She is back to work full time now. Indication for chronic opioid: low back pain Medication and dose: Norco 10-325 # pills per month: 120 Last UDS date: 11-07-19 Opioid Treatment Agreement signed (Y/N): 10-10-18 Opioid Treatment Agreement last reviewed with patient:  09-10-20 NCCSRS reviewed this encounter (include red flags): Yes     Review of Systems     Objective:   Physical Exam        Assessment & Plan:  Pain management, meds were refilled.  [name], MD

## 2020-11-11 ENCOUNTER — Other Ambulatory Visit: Payer: Self-pay | Admitting: Family Medicine

## 2020-11-11 DIAGNOSIS — Z1231 Encounter for screening mammogram for malignant neoplasm of breast: Secondary | ICD-10-CM

## 2020-12-31 ENCOUNTER — Other Ambulatory Visit: Payer: Self-pay

## 2020-12-31 ENCOUNTER — Ambulatory Visit
Admission: RE | Admit: 2020-12-31 | Discharge: 2020-12-31 | Disposition: A | Discharge status: Home / Self Care | Payer: BC Managed Care – PPO | Source: Ambulatory Visit | Attending: Family Medicine | Admitting: Family Medicine

## 2020-12-31 DIAGNOSIS — Z1231 Encounter for screening mammogram for malignant neoplasm of breast: Secondary | ICD-10-CM

## 2021-01-02 ENCOUNTER — Ambulatory Visit: Payer: BC Managed Care – PPO

## 2021-02-11 ENCOUNTER — Telehealth: Payer: Self-pay

## 2021-02-11 NOTE — Telephone Encounter (Signed)
PA sent Key# [id number]

## 2021-02-24 NOTE — Telephone Encounter (Signed)
Insurance Nurse, mental health) is calling in to see if we rec'd the request for more information for the PA they are needing it back before approval can be granted.

## 2021-05-05 ENCOUNTER — Other Ambulatory Visit: Payer: Self-pay

## 2021-05-05 ENCOUNTER — Encounter: Payer: Self-pay | Admitting: Family Medicine

## 2021-05-05 ENCOUNTER — Ambulatory Visit (INDEPENDENT_AMBULATORY_CARE_PROVIDER_SITE_OTHER): Payer: BC Managed Care – PPO | Admitting: Family Medicine

## 2021-05-05 VITALS — BP 150/90 | HR 104 | Temp 97.8°F | Ht 65.0 in | Wt 239.0 lb

## 2021-05-05 DIAGNOSIS — G8929 Other chronic pain: Secondary | ICD-10-CM | POA: Diagnosis not present

## 2021-05-05 DIAGNOSIS — M5440 Lumbago with sciatica, unspecified side: Secondary | ICD-10-CM

## 2021-05-05 DIAGNOSIS — F1190 Opioid use, unspecified, uncomplicated: Secondary | ICD-10-CM | POA: Diagnosis not present

## 2021-05-05 MED ORDER — HYDROCODONE-ACETAMINOPHEN 10-325 MG PO TABS: 1.0000 | ORAL_TABLET | Freq: Four times a day (QID) | ORAL | 0 refills | Status: DC | PRN

## 2021-05-05 MED ORDER — HYDROCODONE-ACETAMINOPHEN 10-325 MG PO TABS: 1.0000 | ORAL_TABLET | Freq: Four times a day (QID) | ORAL | 0 refills | Status: AC | PRN

## 2021-05-05 MED ORDER — OMEPRAZOLE 40 MG PO CPDR
DELAYED_RELEASE_CAPSULE | ORAL | 3 refills | Status: DC
Start: 2021-05-05 — End: 2022-05-28

## 2021-05-05 NOTE — Addendum Note (Signed)
Addended by: Amanda Cockayne on: 05/05/2021 03:41 PM   Modules accepted: Orders

## 2021-05-05 NOTE — Progress Notes (Signed)
   Subjective:    Patient ID: Marie Tran, female    DOB: 03-27-1971, 50 y.o.   MRN: [id number]  HPI Here for pain management,  is doing well. She is excited because of her new job. 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 No more stooping or lifting like before.    Review of Systems  Constitutional: Negative.   Musculoskeletal:  Positive for back pain.      Objective:   Physical Exam Constitutional:      Appearance: Normal appearance.  Neurological:     Mental Status: She is alert.          Assessment & Plan:  Pain management. Indication for chronic opioid: low back pain Medication and dose: Norco 10-325 # pills per month: 120 Last UDS date: 05-05-21 Opioid Treatment Agreement signed (Y/N): 10-10-18 Opioid Treatment Agreement last reviewed with patient:  05-05-21 [facility] reviewed this encounter (include red flags): Yes Meds were refilled.  [name], MD

## 2021-05-06 LAB — DRUG MONITOR, PANEL 1, W/CONF, URINE
Amphetamines: NEGATIVE ng/mL (ref <500)
Barbiturates: NEGATIVE ng/mL (ref <300)
Benzodiazepines: NEGATIVE ng/mL (ref <100)
Cocaine Metabolite: NEGATIVE ng/mL (ref <150)
Creatinine: 58.9 mg/dL (ref >=20.0)
Marijuana Metabolite: NEGATIVE ng/mL (ref <20)
Methadone Metabolite: NEGATIVE ng/mL (ref <100)
Opiates: NEGATIVE ng/mL (ref <100)
Oxidant: NEGATIVE ug/mL (ref <200)
Oxycodone: NEGATIVE ng/mL (ref <100)
Phencyclidine: NEGATIVE ng/mL (ref <25)
pH: 6.8 (ref 4.5–9.0)

## 2021-05-06 LAB — DM TEMPLATE

## 2021-05-07 ENCOUNTER — Telehealth: Payer: Self-pay | Admitting: Family Medicine

## 2021-05-07 NOTE — Telephone Encounter (Signed)
Patient stated that she was here on 05/05/2021 for pain management and Dr.Fry prescribed her HYDROcodone-acetaminophen ([place]) 10-325 MG tablet [[id number]] but her pharmacy would not fill it without a form being filled out.  Patient could be contacted at [phone].  Please advise.

## 2021-05-08 NOTE — Telephone Encounter (Signed)
Please expedite the PA when it comes

## 2021-05-08 NOTE — Telephone Encounter (Signed)
Please advise if PA form has been received.

## 2021-05-09 NOTE — Telephone Encounter (Signed)
We have received a PA request for pt Hydrocodone 10-325 mg. PA has bee submitted to pt insurance for approval. Pt will be notified when  we receive a determination

## 2021-05-15 NOTE — Telephone Encounter (Signed)
Pt Prior Auth was approved and pt picked up Rx from pharmacy

## 2021-07-07 ENCOUNTER — Telehealth (INDEPENDENT_AMBULATORY_CARE_PROVIDER_SITE_OTHER): Payer: BC Managed Care – PPO | Admitting: Nurse Practitioner

## 2021-07-07 ENCOUNTER — Other Ambulatory Visit: Payer: Self-pay

## 2021-07-07 VITALS — Temp 99.0°F

## 2021-07-07 DIAGNOSIS — R0981 Nasal congestion: Secondary | ICD-10-CM | POA: Diagnosis not present

## 2021-07-07 DIAGNOSIS — R0609 Other forms of dyspnea: Secondary | ICD-10-CM

## 2021-07-07 DIAGNOSIS — J069 Acute upper respiratory infection, unspecified: Secondary | ICD-10-CM

## 2021-07-07 DIAGNOSIS — R051 Acute cough: Secondary | ICD-10-CM | POA: Diagnosis not present

## 2021-07-07 MED ORDER — FLUTICASONE PROPIONATE 50 MCG/ACT NA SUSP: 2.0000 | Freq: Every day | NASAL | 0 refills | Status: AC

## 2021-07-07 MED ORDER — ALBUTEROL SULFATE HFA 108 (90 BASE) MCG/ACT IN AERS: 2.0000 | INHALATION_SPRAY | Freq: Four times a day (QID) | RESPIRATORY_TRACT | 0 refills | Status: DC | PRN

## 2021-07-07 MED ORDER — BENZONATATE 200 MG PO CAPS
200.0000 mg | ORAL_CAPSULE | Freq: Three times a day (TID) | ORAL | 0 refills | Status: AC | PRN
Start: 2021-07-07 — End: 2021-07-17

## 2021-07-07 NOTE — Assessment & Plan Note (Signed)
We will send an inhaler the patient can use as needed to see if this helps with her shortness of breath.

## 2021-07-07 NOTE — Assessment & Plan Note (Signed)
Symptoms consistent with a viral infection.  Did discuss signs and symptoms as when to seek urgent or emergent health care.  Patient will continue to monitor and reach back out Wednesday to see how she is doing and we can reevaluate whether antibiotics be appropriate patient acknowledged.  Continue to monitor

## 2021-07-07 NOTE — Assessment & Plan Note (Signed)
Start patient on Flonase 2 sprays each nostril once daily.  Continue to monitor

## 2021-07-07 NOTE — Progress Notes (Signed)
Patient ID: [name], female    DOB: 01/15/1971, 50 y.o.   MRN: [id number]  Virtual visit completed through [place], a video enabled telemedicine application. Due to national recommendations of social distancing due to COVID-19, a virtual visit is felt to be most appropriate for this patient at this time. Reviewed limitations, risks, security and privacy concerns of performing a virtual visit and the availability of in person appointments. I also reviewed that there may be a patient responsible charge related to this service. The patient agreed to proceed.   Attempted to connect via video enabled program, unsuccessful and revert to phone encounter Length of phone call was 11 minutes and 45 seconds  Patient location: home Provider location: [place] [facility], office Persons participating in this virtual visit: patient, provider   If any vitals were documented, they were collected by patient at home unless specified below.    LMP 09/04/2015    CC: Nasal congestion Subjective:   HPI: Marie Tran is a 50 y.o. female presenting on 07/07/2021 for Nasal Congestion (Started with runny nose and some cough on 07/03/21, has had sinus pressure, more cough now-has history of bronchitis, Covid test negative on 07/04/21)   Symptoms started on 07/03/2021 Has taken 3 covid tests on 07/04/2021, 07/05/2021, 07/07/2021 all negiatve No vaccines Coworker had a URI approx 1.5 weeks ago  OTC treatments [place] severe congestion and her daughter got her Mucinex DM liquid. Mucinex may have helped   Relevant past medical, surgical, family and social history reviewed and updated as indicated. Interim medical history since our last visit reviewed. Allergies and medications reviewed and updated. Outpatient Medications Prior to Visit  Medication Sig Dispense Refill   ferrous sulfate 325 (65 FE) MG tablet Take 1 tablet (325 mg total) daily with breakfast by mouth. (Patient not  taking: Reported on 05/05/2021) 90 tablet 3   HYDROcodone-acetaminophen (NORCO) 10-325 MG tablet Take 1 tablet by mouth every 6 (six) hours as needed. 120 tablet 0   levothyroxine (SYNTHROID) 200 MCG tablet TAKE 1 TABLET BY MOUTH EVERY DAY BEFORE BREAKFAST 90 tablet 3   meloxicam (MOBIC) 15 MG tablet Take 1 tablet (15 mg total) by mouth daily. (Patient not taking: Reported on 05/05/2021) 90 tablet 3   omeprazole (PRILOSEC) 40 MG capsule TAKE 1 CAPSULE (40 MG TOTAL) BY MOUTH EVERY EVENING. 90 capsule 3   phentermine (ADIPEX-P) 37.5 MG tablet TAKE 1 TABLET BY MOUTH EVERY DAY IN THE MORNING (Patient not taking: Reported on 05/05/2021) 90 tablet 1   SUMAtriptan (IMITREX) 100 MG tablet Take 1 tablet (100 mg total) as needed by mouth for migraine. May repeat in 2 hours if headache persists or recurs. (Patient not taking: Reported on 05/05/2021) 10 tablet 5   triamcinolone ointment (KENALOG) 0.5 % Apply pea sized amount to affected area daily. 30 g 0   valACYclovir (VALTREX) 1000 MG tablet Take 1 tablet (1,000 mg total) by mouth 2 (two) times daily. 10 tablet 5   No facility-administered medications prior to visit.     Per HPI unless specifically indicated in ROS section below Review of Systems  Constitutional:  Positive for fatigue and fever (99). Negative for chills.  HENT:  Positive for congestion, ear pain, sinus pressure (improved some') and sinus pain. Negative for sore throat.   Respiratory:  Positive for cough (dry) and shortness of breath (with ocugh and DOE).   Cardiovascular:  Negative for chest pain.  Gastrointestinal:  Positive for diarrhea. Negative for abdominal pain,  nausea and vomiting.  Musculoskeletal:  Negative for arthralgias and myalgias.  Neurological:  Negative for headaches.  Objective:  LMP 09/04/2015   Wt Readings from Last 3 Encounters:  05/05/21 239 lb (108.4 kg)  09/10/20 239 lb (108.4 kg)  08/20/20 245 lb 6.4 oz (111.3 kg)       Physical exam: Gen: alert, NAD, not  ill appearing Pulm: speaks in complete sentences without increased work of breathing Psych: normal mood, normal thought content      Results for orders placed or performed in visit on 05/05/21  DRUG MONITOR, PANEL 1, W/CONF, URINE  Result Value Ref Range   Amphetamines NEGATIVE <500 ng/mL   Barbiturates NEGATIVE <300 ng/mL   Benzodiazepines NEGATIVE <100 ng/mL   Cocaine Metabolite NEGATIVE <150 ng/mL   Marijuana Metabolite NEGATIVE <20 ng/mL   Methadone Metabolite NEGATIVE <100 ng/mL   Opiates NEGATIVE <100 ng/mL   Oxycodone NEGATIVE <100 ng/mL   Phencyclidine NEGATIVE <25 ng/mL   Creatinine 58.9 > or = 20.0 mg/dL   pH 6.8 4.5 - 9.0   Oxidant NEGATIVE <200 mcg/mL  DM TEMPLATE  Result Value Ref Range   Notes and Comments     Assessment & Plan:   Problem List Items Addressed This Visit       Respiratory   Viral upper respiratory tract infection - Primary    Symptoms consistent with a viral infection.  Did discuss signs and symptoms as when to seek urgent or emergent health care.  Patient will continue to monitor and reach back out Wednesday to see how she is doing and we can reevaluate whether antibiotics be appropriate patient acknowledged.  Continue to monitor        Other   Acute cough    Tessalon Perles 200 mg 3 times daily as needed cough.      Relevant Medications   benzonatate (TESSALON) 200 MG capsule   Nasal congestion    Start patient on Flonase 2 sprays each nostril once daily.  Continue to monitor      Relevant Medications   fluticasone (FLONASE) 50 MCG/ACT nasal spray   DOE (dyspnea on exertion)    We will send an inhaler the patient can use as needed to see if this helps with her shortness of breath.      Relevant Medications   albuterol (VENTOLIN HFA) 108 (90 Base) MCG/ACT inhaler     No orders of the defined types were placed in this encounter.  No orders of the defined types were placed in this encounter.   I discussed the assessment and  treatment plan with the patient. The patient was provided an opportunity to ask questions and all were answered. The patient agreed with the plan and demonstrated an understanding of the instructions. The patient was advised to call back or seek an in-person evaluation if the symptoms worsen or if the condition fails to improve as anticipated.  Follow up plan: No follow-ups on file.  Romilda Garret, NP

## 2021-07-07 NOTE — Assessment & Plan Note (Signed)
Tessalon Perles 200 mg 3 times daily as needed cough.

## 2021-07-09 ENCOUNTER — Encounter: Payer: Self-pay | Admitting: Nurse Practitioner

## 2021-09-12 ENCOUNTER — Telehealth: Payer: Self-pay | Admitting: Family Medicine

## 2021-09-12 NOTE — Telephone Encounter (Signed)
Patient daughter Marie Tran called in stating that Dr.Fry ok'd for her to become a patient of his. I don't see anywhere in patient chart stating that.  Is this true?  Please advise.  Please route back to me.

## 2021-09-12 NOTE — Telephone Encounter (Signed)
Please advise 

## 2021-09-15 NOTE — Telephone Encounter (Signed)
Yes I agreed to see her  

## 2021-09-19 NOTE — Telephone Encounter (Signed)
Attempted to call pt no way to speak with pt, will try later

## 2021-09-19 NOTE — Telephone Encounter (Signed)
Pt will have a daughter callback and be sch as new pt

## 2021-09-19 NOTE — Telephone Encounter (Signed)
Pr daughter has been sch for 10-03-2021

## 2021-09-23 NOTE — Telephone Encounter (Signed)
Noted  

## 2021-10-07 ENCOUNTER — Ambulatory Visit (INDEPENDENT_AMBULATORY_CARE_PROVIDER_SITE_OTHER): Payer: BC Managed Care – PPO

## 2021-10-07 ENCOUNTER — Other Ambulatory Visit: Payer: Self-pay

## 2021-10-07 ENCOUNTER — Ambulatory Visit (INDEPENDENT_AMBULATORY_CARE_PROVIDER_SITE_OTHER): Payer: BC Managed Care – PPO | Admitting: Family Medicine

## 2021-10-07 ENCOUNTER — Encounter: Payer: Self-pay | Admitting: Family Medicine

## 2021-10-07 VITALS — BP 140/96 | HR 81 | Temp 98.4°F | Wt 239.0 lb

## 2021-10-07 DIAGNOSIS — J3489 Other specified disorders of nose and nasal sinuses: Secondary | ICD-10-CM | POA: Diagnosis not present

## 2021-10-07 DIAGNOSIS — S0033XA Contusion of nose, initial encounter: Secondary | ICD-10-CM

## 2021-10-07 NOTE — Progress Notes (Signed)
° °  Subjective:    Patient ID: Marie Tran, female    DOB: 05-Mar-1971, 51 y.o.   MRN: [id number]  HPI Here for injuries form a fall at her home last night. As she was walking up some steps to her front door, she apparently tripped over a step and feel forward onto some gravel. Her face hit the gravel fairly hard, but there was no LOC. She has some other minor scrapes on the legs. She has been icing her nose since then. Th nose is mildly painful but she can breathe through it normally. Both nostrils were bleeding for a while after this happened, but this has stopped.    Review of Systems  Constitutional: Negative.   HENT:  Positive for facial swelling and nosebleeds. Negative for dental problem, sinus pain and sore throat.   Eyes: Negative.   Respiratory: Negative.    Cardiovascular: Negative.   Musculoskeletal: Negative.   Neurological: Negative.       Objective:   Physical Exam Constitutional:      General: She is not in acute distress.    Appearance: Normal appearance.  HENT:     Head: Normocephalic and atraumatic.     Nose:     Comments: The nose is swollen and tender at the base. No crepitus. Alignment is normal. These are several superficial lacerations across the nose.     Mouth/Throat:     Pharynx: Oropharynx is clear.  Eyes:     Extraocular Movements: Extraocular movements intact.     Conjunctiva/sclera: Conjunctivae normal.     Pupils: Pupils are equal, round, and reactive to light.  Cardiovascular:     Rate and Rhythm: Normal rate and regular rhythm.     Pulses: Normal pulses.     Heart sounds: Normal heart sounds.  Pulmonary:     Effort: Pulmonary effort is normal.     Breath sounds: Normal breath sounds.  Musculoskeletal:     Cervical back: Normal range of motion. No tenderness.  Neurological:     General: No focal deficit present.     Mental Status: She is alert and oriented to person, place, and time.          Assessment & Plan:  She has a  nasal contusion as the result of a fall. I doubt there are any fractures, but we will get Xrays to make sure. She will apply ice several times a day. Recheck as needed.  [name], MD

## 2021-10-27 ENCOUNTER — Telehealth (INDEPENDENT_AMBULATORY_CARE_PROVIDER_SITE_OTHER): Payer: BC Managed Care – PPO | Admitting: Family Medicine

## 2021-10-27 ENCOUNTER — Encounter: Payer: Self-pay | Admitting: Family Medicine

## 2021-10-27 DIAGNOSIS — J019 Acute sinusitis, unspecified: Secondary | ICD-10-CM | POA: Diagnosis not present

## 2021-10-27 MED ORDER — AMOXICILLIN-POT CLAVULANATE 875-125 MG PO TABS: 1.0000 | ORAL_TABLET | Freq: Two times a day (BID) | ORAL | 0 refills | Status: DC

## 2021-10-27 NOTE — Progress Notes (Signed)
? ?Subjective:  ? ? Patient ID: Marie Tran, female    DOB: July 16, 1971, 51 y.o.   MRN: [id number] ? ?HPI ?Virtual Visit via Video Note ? ?I connected with the patient on 10/27/21 at  1:30 PM EDT by a video enabled telemedicine application and verified that I am speaking with the correct person using two identifiers. ? Location patient: home ?Location provider:work or home office ?Persons participating in the virtual visit: patient, provider ? ?I discussed the limitations of evaluation and management by telemedicine and the availability of in person appointments. The patient expressed understanding and agreed to proceed. ? ? ?HPI: ?Here for one week of sinus pressure, facial swelling, and PND. No fever or cough. She has tested negative twice for the Covid virus. Taking Mucinex.  ? ? ?ROS: See pertinent positives and negatives per HPI. ? ?Past Medical History:  ?Diagnosis Date  ? Breast lump   ? right breast, UOQ, intramammary lymph node, also left breast cyst   ? Complication of anesthesia   ? hard to wake up after lap choli  ? Fatty liver disease, nonalcoholic   ? Hoarseness   ? Hyperlipidemia   ? Hypothyroidism   ? Migraines   ? Neck pain, chronic   ? sees Dr. Phylliss Bob  ? Obesity   ? Overactive bladder   ? PONV (postoperative nausea and vomiting)   ? had to have scope patch last surgery  ? Snores   ? Wears dentures   ? top  ? ? ?Past Surgical History:  ?Procedure Laterality Date  ? [place] SURGERY  3/14  ? at C5-C6 per Dr. Lynann Bologna   ? CHOLECYSTECTOMY  2009  ? ROTATOR CUFF REPAIR W/ DISTAL CLAVICLE EXCISION Right 09-05-13  ? per Dr. Tamera Punt   ? TUBAL LIGATION    ? ? ?Family History  ?Problem Relation Age of Onset  ? Cancer Mother   ?     breast  ? Colon polyps Mother   ? Bladder Cancer Father   ? Leukemia Father   ? Colon polyps Father   ? Hypertension Other   ? Cancer Other   ?     ovarian,uterine,breast  ? Stroke Other   ?     female 1st degree relative <50  ? Cancer Brother   ?     brain  ?  Brain cancer Brother   ? ? ? ?Current Outpatient Medications:  ?  albuterol (VENTOLIN HFA) 108 (90 Base) MCG/ACT inhaler, Inhale 2 puffs into the lungs every 6 (six) hours as needed for wheezing or shortness of breath., Disp: 8 g, Rfl: 0 ?  amoxicillin-clavulanate (AUGMENTIN) 875-125 MG tablet, Take 1 tablet by mouth 2 (two) times daily., Disp: 20 tablet, Rfl: 0 ?  fluticasone (FLONASE) 50 MCG/ACT nasal spray, Place 2 sprays into both nostrils daily., Disp: 16 g, Rfl: 0 ?  levothyroxine (SYNTHROID) 200 MCG tablet, TAKE 1 TABLET BY MOUTH EVERY DAY BEFORE BREAKFAST, Disp: 90 tablet, Rfl: 3 ?  omeprazole (PRILOSEC) 40 MG capsule, TAKE 1 CAPSULE (40 MG TOTAL) BY MOUTH EVERY EVENING., Disp: 90 capsule, Rfl: 3 ?  SUMAtriptan (IMITREX) 100 MG tablet, Take 1 tablet (100 mg total) as needed by mouth for migraine. May repeat in 2 hours if headache persists or recurs., Disp: 10 tablet, Rfl: 5 ?  triamcinolone ointment (KENALOG) 0.5 %, Apply pea sized amount to affected area daily., Disp: 30 g, Rfl: 0 ?  valACYclovir (VALTREX) 1000 MG tablet, Take 1 tablet (1,000 mg total)  by mouth 2 (two) times daily., Disp: 10 tablet, Rfl: 5 ? ?EXAM: ? ?VITALS per patient if applicable: ? ?GENERAL: alert, oriented, appears well and in no acute distress ? ?HEENT: atraumatic, conjunttiva clear, no obvious abnormalities on inspection of external nose and ears ? ?NECK: normal movements of the head and neck ? ?LUNGS: on inspection no signs of respiratory distress, breathing rate appears normal, no obvious gross SOB, gasping or wheezing ? ?CV: no obvious cyanosis ? ?MS: moves all visible extremities without noticeable abnormality ? ?PSYCH/NEURO: pleasant and cooperative, no obvious depression or anxiety, speech and thought processing grossly intact ? ?ASSESSMENT AND PLAN: ?Sinusitis, treat with Augmentin. ?[name], MD ? ?Discussed the following assessment and plan: ? ?No diagnosis found. ? ? ?  ?I discussed the assessment and treatment plan  with the patient. The patient was provided an opportunity to ask questions and all were answered. The patient agreed with the plan and demonstrated an understanding of the instructions. ?  ?The patient was advised to call back or seek an in-person evaluation if the symptoms worsen or if the condition fails to improve as anticipated. ? ?  ? ? ?Review of Systems ? ?   ?Objective:  ? Physical Exam ? ? ? ? ?   ?Assessment & Plan:  ? ? ?

## 2021-12-04 ENCOUNTER — Other Ambulatory Visit: Payer: Self-pay | Admitting: Family Medicine

## 2021-12-04 DIAGNOSIS — Z1231 Encounter for screening mammogram for malignant neoplasm of breast: Secondary | ICD-10-CM

## 2021-12-15 ENCOUNTER — Other Ambulatory Visit: Payer: Self-pay | Admitting: Family Medicine

## 2021-12-15 NOTE — Telephone Encounter (Signed)
Pt needs labs for further refills ?

## 2021-12-29 ENCOUNTER — Encounter: Payer: Self-pay | Admitting: Family Medicine

## 2021-12-29 ENCOUNTER — Ambulatory Visit (INDEPENDENT_AMBULATORY_CARE_PROVIDER_SITE_OTHER): Payer: BC Managed Care – PPO | Admitting: Family Medicine

## 2021-12-29 VITALS — BP 136/88 | HR 79 | Temp 98.8°F | Wt 259.0 lb

## 2021-12-29 DIAGNOSIS — F1190 Opioid use, unspecified, uncomplicated: Secondary | ICD-10-CM

## 2021-12-29 DIAGNOSIS — G8929 Other chronic pain: Secondary | ICD-10-CM | POA: Diagnosis not present

## 2021-12-29 DIAGNOSIS — M5440 Lumbago with sciatica, unspecified side: Secondary | ICD-10-CM

## 2021-12-29 MED ORDER — HYDROCODONE-ACETAMINOPHEN 10-325 MG PO TABS
1.0000 | ORAL_TABLET | Freq: Four times a day (QID) | ORAL | 0 refills | Status: AC | PRN
Start: 2022-02-28 — End: 2022-03-30

## 2021-12-29 MED ORDER — HYDROCODONE-ACETAMINOPHEN 10-325 MG PO TABS
1.0000 | ORAL_TABLET | Freq: Four times a day (QID) | ORAL | 0 refills | Status: DC | PRN
Start: 2021-12-29 — End: 2021-12-29

## 2021-12-29 MED ORDER — HYDROCODONE-ACETAMINOPHEN 10-325 MG PO TABS
1.0000 | ORAL_TABLET | Freq: Four times a day (QID) | ORAL | 0 refills | Status: DC | PRN
Start: 2022-01-29 — End: 2021-12-29

## 2021-12-29 MED ORDER — MELOXICAM 15 MG PO TABS: 15.0000 mg | ORAL_TABLET | Freq: Every day | ORAL | 3 refills | Status: DC

## 2021-12-29 NOTE — Progress Notes (Signed)
? ?  Subjective:  ? ? Patient ID: Marie Tran, female    DOB: 11/27/1970, 51 y.o.   MRN: [id number] ? ?HPI ?Here for pain management. She says the right sided low back pain has gotten much worse in the past 2 months. She feels "pressure" in the bottm of the spine and a sharp pain shoots down the right leg to the foot. No numbness or tinglng.  ? ? ?Review of Systems  ?Constitutional: Negative.   ?Musculoskeletal:  Positive for back pain.  ? ?   ?Objective:  ? Physical Exam ?Constitutional:   ?   Appearance: Normal appearance.  ?Neurological:  ?   Mental Status: She is alert.  ? ? ? ? ? ?   ?Assessment & Plan:  ?Pain management. ?Indication for chronic opioid: low back pain ?Medication and dose: Norco 10-325 ?# pills per month: 120 ?Last UDS date: 05-05-21 ?Opioid Treatment Agreement signed (Y/N): 10-10-18 ?Opioid Treatment Agreement last reviewed with patient:  12-29-21 ?N[facility] reviewed this encounter (include red flags): Yes ?The Norco was refilled. We will add Meloxicam 15 mg daily to this. We will set up another lumbar MRI soon.  ?[name], MD ? ? ?

## 2022-01-01 ENCOUNTER — Ambulatory Visit
Admission: RE | Admit: 2022-01-01 | Discharge: 2022-01-01 | Disposition: A | Discharge status: Home / Self Care | Payer: BC Managed Care – PPO | Source: Ambulatory Visit | Attending: Family Medicine | Admitting: Family Medicine

## 2022-01-01 DIAGNOSIS — Z1231 Encounter for screening mammogram for malignant neoplasm of breast: Secondary | ICD-10-CM

## 2022-02-16 ENCOUNTER — Other Ambulatory Visit: Payer: Self-pay | Admitting: Family Medicine

## 2022-03-16 ENCOUNTER — Ambulatory Visit (INDEPENDENT_AMBULATORY_CARE_PROVIDER_SITE_OTHER): Payer: BC Managed Care – PPO | Admitting: Family Medicine

## 2022-03-16 ENCOUNTER — Encounter: Payer: Self-pay | Admitting: Family Medicine

## 2022-03-16 VITALS — BP 128/86 | HR 88 | Temp 98.9°F | Wt 245.0 lb

## 2022-03-16 DIAGNOSIS — J029 Acute pharyngitis, unspecified: Secondary | ICD-10-CM | POA: Diagnosis not present

## 2022-03-16 DIAGNOSIS — J020 Streptococcal pharyngitis: Secondary | ICD-10-CM

## 2022-03-16 LAB — POCT RAPID STREP A (OFFICE): Rapid Strep A Screen: POSITIVE — AB

## 2022-03-16 MED ORDER — CEFUROXIME AXETIL 500 MG PO TABS: 500.0000 mg | ORAL_TABLET | Freq: Two times a day (BID) | ORAL | 0 refills | Status: AC

## 2022-03-16 NOTE — Progress Notes (Signed)
   Subjective:    Patient ID: Marie Tran, female    DOB: August 27, 1970, 51 y.o.   MRN: [id number]  HPI Here for 2 days of a ST on the right side swelling of the lymph nodes on the right side, and right ear pain. No fever or cough.    Review of Systems  Constitutional: Negative.   HENT:  Positive for ear pain and sore throat. Negative for congestion, postnasal drip and sinus pressure.   Eyes: Negative.   Respiratory: Negative.         Objective:   Physical Exam Constitutional:      Appearance: Normal appearance. She is not ill-appearing.  HENT:     Right Ear: Tympanic membrane, ear canal and external ear normal.     Left Ear: Tympanic membrane, ear canal and external ear normal.     Nose: Nose normal.     Mouth/Throat:     Pharynx: Oropharyngeal exudate and posterior oropharyngeal erythema present.  Eyes:     Conjunctiva/sclera: Conjunctivae normal.  Pulmonary:     Effort: Pulmonary effort is normal.     Breath sounds: Normal breath sounds.  Lymphadenopathy:     Cervical: Cervical adenopathy present.  Neurological:     Mental Status: She is alert.           Assessment & Plan:  Strep pharyngitis, treat with 10 days of Cefuroxime.  [name], MD

## 2022-03-18 ENCOUNTER — Telehealth: Payer: Self-pay | Admitting: Family Medicine

## 2022-03-18 NOTE — Telephone Encounter (Signed)
Called and spoke with patient.   Pt had appointment with Dr. Sarajane Jews on 03/16/22, and was prescribed Ceftin 500 mg.    Around 24 hours ago, patient stated that she began excessively sweating, during the day, and at night.  This morning,while at work she began excessively sweating so extreme that her clothes were damp.    At work patient felt as if she was about to pass out, so her employer sent her home.     Patient stated she takes the antibiotic at 7am and 7 pm.    Should patient continue medication or stop?   Please advise.

## 2022-03-18 NOTE — Telephone Encounter (Signed)
Complaining of excessive sweating x 2 days, wonders if this a side effect of the condition or the antibiotic, states she almost passed out while sitting down is being sent home from work requests a call.

## 2022-03-19 NOTE — Telephone Encounter (Signed)
Lvm for patient to call back for message.   Sent patient, My Chart message also.

## 2022-04-08 ENCOUNTER — Other Ambulatory Visit: Payer: Self-pay | Admitting: Family Medicine

## 2022-05-01 ENCOUNTER — Encounter: Payer: Self-pay | Admitting: Adult Health

## 2022-05-01 ENCOUNTER — Ambulatory Visit (INDEPENDENT_AMBULATORY_CARE_PROVIDER_SITE_OTHER): Payer: BC Managed Care – PPO | Admitting: Adult Health

## 2022-05-01 VITALS — BP 130/88 | HR 75 | Temp 98.4°F | Ht 65.0 in | Wt 245.0 lb

## 2022-05-01 DIAGNOSIS — H109 Unspecified conjunctivitis: Secondary | ICD-10-CM

## 2022-05-01 MED ORDER — ERYTHROMYCIN 5 MG/GM OP OINT: 1.0000 | TOPICAL_OINTMENT | Freq: Three times a day (TID) | OPHTHALMIC | 0 refills | Status: AC

## 2022-05-01 NOTE — Progress Notes (Signed)
Subjective:    Patient ID: Marie Tran, female    DOB: Nov 15, 1970, 51 y.o.   MRN: [id number]  HPI 51 year old female who  has a past medical history of Breast lump, Complication of anesthesia, Fatty liver disease, nonalcoholic, Hoarseness, Hyperlipidemia, Hypothyroidism, Migraines, Neck pain, chronic, Obesity, Overactive bladder, PONV (postoperative nausea and vomiting), Snores, and Wears dentures.  She presents to the office today for concern of conjunctivitis. Her symptoms started about 24 hours ago.  Symptoms include watery eye, gritty sensation, and eyelid matting.   Review of Systems See HPI   Past Medical History:  Diagnosis Date   Breast lump    right breast, UOQ, intramammary lymph node, also left breast cyst    Complication of anesthesia    hard to wake up after lap choli   Fatty liver disease, nonalcoholic    Hoarseness    Hyperlipidemia    Hypothyroidism    Migraines    Neck pain, chronic    sees Dr. Phylliss Bob   Obesity    Overactive bladder    PONV (postoperative nausea and vomiting)    had to have scope patch last surgery   Snores    Wears dentures    top    Social History   Socioeconomic History   Marital status: Married    Spouse name: Not on file   Number of children: 3   Years of education: Not on file   Highest education level: Not on file  Occupational History   Not on file  Tobacco Use   Smoking status: Former    Types: Cigarettes    Quit date: 09/08/1988    Years since quitting: 33.6   Smokeless tobacco: Never  Substance and Sexual Activity   Alcohol use: No    Alcohol/week: 0.0 standard drinks of alcohol   Drug use: No   Sexual activity: Not on file  Other Topics Concern   Not on file  Social History Narrative   Not on file   Social Determinants of Health   Financial Resource Strain: Not on file  Food Insecurity: Not on file  Transportation Needs: Not on file  Physical Activity: Not on file  Stress: Not on file   Social Connections: Not on file  Intimate Partner Violence: Not on file    Past Surgical History:  Procedure Laterality Date   CERVICAL DISC SURGERY  3/14   at [facility] per Dr. Lynann Bologna    CHOLECYSTECTOMY  2009   ROTATOR CUFF REPAIR W/ DISTAL CLAVICLE EXCISION Right 09-05-13   per Dr. Tamera Punt    TUBAL LIGATION      Family History  Problem Relation Age of Onset   Cancer Mother        breast   Colon polyps Mother    Bladder Cancer Father    Leukemia Father    Colon polyps Father    Hypertension Other    Cancer Other        ovarian,uterine,breast   Stroke Other        female 1st degree relative <50   Cancer Brother        brain   Brain cancer Brother     No Known Allergies  Current Outpatient Medications on File Prior to Visit  Medication Sig Dispense Refill   albuterol (VENTOLIN HFA) 108 (90 Base) MCG/ACT inhaler Inhale 2 puffs into the lungs every 6 (six) hours as needed for wheezing or shortness of breath. 8 g 0   fluticasone (  FLONASE) 50 MCG/ACT nasal spray Place 2 sprays into both nostrils daily. 16 g 0   levothyroxine (SYNTHROID) 200 MCG tablet TAKE 1 TABLET BY MOUTH EVERY DAY BEFORE BREAKFAST 90 tablet 0   meloxicam (MOBIC) 15 MG tablet Take 1 tablet (15 mg total) by mouth daily. 90 tablet 3   omeprazole (PRILOSEC) 40 MG capsule TAKE 1 CAPSULE (40 MG TOTAL) BY MOUTH EVERY EVENING. 90 capsule 3   SUMAtriptan (IMITREX) 100 MG tablet Take 1 tablet (100 mg total) as needed by mouth for migraine. May repeat in 2 hours if headache persists or recurs. 10 tablet 5   triamcinolone ointment (KENALOG) 0.5 % Apply pea sized amount to affected area daily. 30 g 0   valACYclovir (VALTREX) 1000 MG tablet Take 1 tablet (1,000 mg total) by mouth 2 (two) times daily. 10 tablet 5   No current facility-administered medications on file prior to visit.    BP 130/88   Pulse 75   Temp 98.4 F (36.9 C) (Oral)   Ht '5\' 5"'$  (1.651 m)   Wt 245 lb (111.1 kg)   LMP 09/04/2015   SpO2 98%   BMI  40.77 kg/m       Objective:   Physical Exam Vitals and nursing note reviewed.  Constitutional:      Appearance: Normal appearance.  Eyes:     Conjunctiva/sclera:     Left eye: Left conjunctiva is injected.     Comments: Conjunctivitis noted in left eye.  No signs of trauma.  Skin:    General: Skin is warm and dry.  Neurological:     General: No focal deficit present.     Mental Status: She is alert and oriented to person, place, and time.  Psychiatric:        Mood and Affect: Mood normal.        Behavior: Behavior normal.        Thought Content: Thought content normal.        Judgment: Judgment normal.       Assessment & Plan:  1. Bacterial conjunctivitis of left eye - erythromycin ophthalmic ointment; Place 1 Application into the left eye 3 (three) times daily for 7 days.  Dispense: 3.5 g; Refill: 0 - Follow up if not resolved  Dorothyann Peng, NP

## 2022-05-28 ENCOUNTER — Encounter: Payer: Self-pay | Admitting: Family Medicine

## 2022-05-28 ENCOUNTER — Ambulatory Visit (INDEPENDENT_AMBULATORY_CARE_PROVIDER_SITE_OTHER): Payer: BC Managed Care – PPO | Admitting: Family Medicine

## 2022-05-28 VITALS — BP 130/86 | HR 74 | Temp 98.3°F | Wt 240.0 lb

## 2022-05-28 DIAGNOSIS — G8929 Other chronic pain: Secondary | ICD-10-CM

## 2022-05-28 DIAGNOSIS — M5440 Lumbago with sciatica, unspecified side: Secondary | ICD-10-CM | POA: Diagnosis not present

## 2022-05-28 DIAGNOSIS — G4700 Insomnia, unspecified: Secondary | ICD-10-CM

## 2022-05-28 DIAGNOSIS — F1190 Opioid use, unspecified, uncomplicated: Secondary | ICD-10-CM

## 2022-05-28 MED ORDER — HYDROCODONE-ACETAMINOPHEN 10-325 MG PO TABS: 1.0000 | ORAL_TABLET | Freq: Four times a day (QID) | ORAL | 0 refills | Status: DC | PRN

## 2022-05-28 MED ORDER — MIRTAZAPINE 15 MG PO TABS: 15.0000 mg | ORAL_TABLET | Freq: Every day | ORAL | 2 refills | Status: DC

## 2022-05-28 MED ORDER — OMEPRAZOLE 40 MG PO CPDR
DELAYED_RELEASE_CAPSULE | ORAL | 3 refills | Status: DC
Start: 2022-05-28 — End: 2023-10-12

## 2022-05-28 MED ORDER — HYDROCODONE-ACETAMINOPHEN 10-325 MG PO TABS: 1.0000 | ORAL_TABLET | Freq: Four times a day (QID) | ORAL | 0 refills | Status: AC | PRN

## 2022-05-28 NOTE — Addendum Note (Signed)
Addended by: [name] A on: 05/28/2022 09:17 AM   Modules accepted: Orders

## 2022-05-28 NOTE — Progress Notes (Signed)
   Subjective:    Patient ID: [name], female    DOB: 04-Sep-1970, 51 y.o.   MRN: [id number]  HPI Here for pain management. She is doing fairly well as far as pain control. She stopped taking Meloxicam because it gave her "bad dreams". She also has trouble sleeping most nights, and this is partly due to back pain and partly due to difficulty relaxing. During th day when she is working, she does not think too much about her late husband, but at night she thinks about him quite a bit.    Review of Systems  Constitutional: Negative.   Musculoskeletal:  Positive for back pain.  Psychiatric/Behavioral:  Positive for sleep disturbance.        Objective:   Physical Exam Constitutional:      Appearance: Normal appearance.  Neurological:     Mental Status: She is alert.  Psychiatric:     Comments: She is tearful            Assessment & Plan:  Pain management. Indication for chronic opioid: low back pain Medication and dose: Norco 10-325 # pills per month: 120 Last UDS date: 05-28-22 Opioid Treatment Agreement signed (Y/N): 10-10-18 Opioid Treatment Agreement last reviewed with patient:  05-28-22 [facility] reviewed this encounter (include red flags): Yes The Norco was refilled. For sleep, she will try Mirtazepine 15 mg at bedtime.  [name], MD

## 2022-05-31 LAB — DRUG MONITOR, PANEL 1, W/CONF, URINE
Amphetamines: NEGATIVE ng/mL (ref <500)
Barbiturates: NEGATIVE ng/mL (ref <300)
Benzodiazepines: NEGATIVE ng/mL (ref <100)
Cocaine Metabolite: NEGATIVE ng/mL (ref <150)
Codeine: NEGATIVE ng/mL (ref <50)
Creatinine: 56.8 mg/dL (ref >=20.0)
Hydrocodone: 561 ng/mL — ABNORMAL HIGH (ref <50)
Hydromorphone: 125 ng/mL — ABNORMAL HIGH (ref <50)
Marijuana Metabolite: NEGATIVE ng/mL (ref <20)
Methadone Metabolite: NEGATIVE ng/mL (ref <100)
Morphine: NEGATIVE ng/mL (ref <50)
Norhydrocodone: 955 ng/mL — ABNORMAL HIGH (ref <50)
Opiates: POSITIVE ng/mL — AB (ref <100)
Oxidant: NEGATIVE ug/mL (ref <200)
Oxycodone: NEGATIVE ng/mL (ref <100)
Phencyclidine: NEGATIVE ng/mL (ref <25)
pH: 6.4 (ref 4.5–9.0)

## 2022-05-31 LAB — DM TEMPLATE

## 2022-06-11 ENCOUNTER — Telehealth: Payer: Self-pay | Admitting: Family Medicine

## 2022-06-11 NOTE — Telephone Encounter (Signed)
Pt is calling and [facility] not [facility] in stock HYDROcodone-acetaminophen (NORCO) 10-325 MG tablet however the do have 5-325 mg  [id number] [facility] [address] [place]:  [phone] Fax:  [phone]

## 2022-06-12 MED ORDER — HYDROCODONE-ACETAMINOPHEN 5-325 MG PO TABS: 1.0000 | ORAL_TABLET | Freq: Four times a day (QID) | ORAL | 0 refills | Status: AC | PRN

## 2022-06-12 NOTE — Telephone Encounter (Signed)
I sent in 30 days of the 5-325

## 2022-07-23 ENCOUNTER — Telehealth: Payer: Self-pay | Admitting: Family Medicine

## 2022-07-23 NOTE — Telephone Encounter (Signed)
Pt is calling and the pharm only has 28 pills in stock of HYDROcodone-acetaminophen (NORCO) 10-325 MG tablet and if pt get 28 pills it will void the remaining pills. Pt would like a new rx HYDROcodone-acetaminophen ([place]) 5-325 MG tablet  [id number] [address] AT [place]: [phone] Fax: [phone]

## 2022-07-24 MED ORDER — HYDROCODONE-ACETAMINOPHEN 5-325 MG PO TABS: 1.0000 | ORAL_TABLET | Freq: Four times a day (QID) | ORAL | 0 refills | Status: AC | PRN

## 2022-07-24 NOTE — Telephone Encounter (Signed)
Done

## 2022-08-02 ENCOUNTER — Other Ambulatory Visit: Payer: Self-pay | Admitting: Family Medicine

## 2022-09-24 ENCOUNTER — Ambulatory Visit (INDEPENDENT_AMBULATORY_CARE_PROVIDER_SITE_OTHER)
Admission: RE | Admit: 2022-09-24 | Discharge: 2022-09-24 | Disposition: A | Discharge status: Home / Self Care | Payer: BC Managed Care – PPO | Source: Ambulatory Visit | Attending: Family Medicine | Admitting: Family Medicine

## 2022-09-24 ENCOUNTER — Encounter: Payer: Self-pay | Admitting: Gastroenterology

## 2022-09-24 ENCOUNTER — Ambulatory Visit (INDEPENDENT_AMBULATORY_CARE_PROVIDER_SITE_OTHER): Payer: BC Managed Care – PPO | Admitting: Family Medicine

## 2022-09-24 ENCOUNTER — Encounter: Payer: Self-pay | Admitting: Family Medicine

## 2022-09-24 VITALS — BP 136/84 | HR 73 | Temp 98.4°F | Ht 64.5 in | Wt 244.0 lb

## 2022-09-24 DIAGNOSIS — Z0000 Encounter for general adult medical examination without abnormal findings: Secondary | ICD-10-CM | POA: Diagnosis not present

## 2022-09-24 DIAGNOSIS — E039 Hypothyroidism, unspecified: Secondary | ICD-10-CM

## 2022-09-24 DIAGNOSIS — G8929 Other chronic pain: Secondary | ICD-10-CM

## 2022-09-24 DIAGNOSIS — M546 Pain in thoracic spine: Secondary | ICD-10-CM | POA: Diagnosis not present

## 2022-09-24 LAB — CBC WITH DIFFERENTIAL/PLATELET
Basophils Absolute: 0.1 10*3/uL (ref 0.0–0.1)
Basophils Relative: 1.1 % (ref 0.0–3.0)
Eosinophils Absolute: 0.2 10*3/uL (ref 0.0–0.7)
Eosinophils Relative: 2.5 % (ref 0.0–5.0)
HCT: 44.4 % (ref 36.0–46.0)
Hemoglobin: 14.9 g/dL (ref 12.0–15.0)
Lymphocytes Relative: 36.1 % (ref 12.0–46.0)
Lymphs Abs: 2.3 10*3/uL (ref 0.7–4.0)
MCHC: 33.7 g/dL (ref 30.0–36.0)
MCV: 91.8 fl (ref 78.0–100.0)
Monocytes Absolute: 0.5 10*3/uL (ref 0.1–1.0)
Monocytes Relative: 7.4 % (ref 3.0–12.0)
Neutro Abs: 3.4 10*3/uL (ref 1.4–7.7)
Neutrophils Relative %: 52.9 % (ref 43.0–77.0)
Platelets: 290 10*3/uL (ref 150.0–400.0)
RBC: 4.84 Mil/uL (ref 3.87–5.11)
RDW: 14.8 % (ref 11.5–15.5)
WBC: 6.5 10*3/uL (ref 4.0–10.5)

## 2022-09-24 LAB — BASIC METABOLIC PANEL
BUN: 10 mg/dL (ref 6–23)
CO2: 25 mEq/L (ref 19–32)
Calcium: 9.9 mg/dL (ref 8.4–10.5)
Chloride: 103 mEq/L (ref 96–112)
Creatinine, Ser: 1.08 mg/dL (ref 0.40–1.20)
GFR: 59.11 mL/min — ABNORMAL LOW (ref >60.00)
Glucose, Bld: 86 mg/dL (ref 70–99)
Potassium: 3.6 mEq/L (ref 3.5–5.1)
Sodium: 142 mEq/L (ref 135–145)

## 2022-09-24 LAB — HEPATIC FUNCTION PANEL
ALT: 20 U/L (ref 0–35)
AST: 30 U/L (ref 0–37)
Albumin: 4.6 g/dL (ref 3.5–5.2)
Alkaline Phosphatase: 101 U/L (ref 39–117)
Bilirubin, Direct: 0.1 mg/dL (ref 0.0–0.3)
Total Bilirubin: 0.5 mg/dL (ref 0.2–1.2)
Total Protein: 8.1 g/dL (ref 6.0–8.3)

## 2022-09-24 LAB — HEMOGLOBIN A1C: Hgb A1c MFr Bld: 5.9 % (ref 4.6–6.5)

## 2022-09-24 LAB — LIPID PANEL
Cholesterol: 368 mg/dL — ABNORMAL HIGH (ref 0–200)
HDL: 68.3 mg/dL (ref >39.00)
LDL Cholesterol: 271 mg/dL — ABNORMAL HIGH (ref 0–99)
NonHDL: 299.27
Total CHOL/HDL Ratio: 5
Triglycerides: 143 mg/dL (ref 0.0–149.0)
VLDL: 28.6 mg/dL (ref 0.0–40.0)

## 2022-09-24 LAB — TSH: TSH: 226.33 u[IU]/mL — ABNORMAL HIGH (ref 0.35–5.50)

## 2022-09-24 LAB — T4, FREE: Free T4: <0.25 ng/dL — ABNORMAL LOW (ref 0.60–1.60)

## 2022-09-24 LAB — T3, FREE: T3, Free: 2 pg/mL — ABNORMAL LOW (ref 2.3–4.2)

## 2022-09-24 MED ORDER — LEVOTHYROXINE SODIUM 200 MCG PO TABS: 200.0000 ug | ORAL_TABLET | Freq: Every day | ORAL | 3 refills | Status: DC

## 2022-09-24 MED ORDER — VALACYCLOVIR HCL 1 G PO TABS: 1000.0000 mg | ORAL_TABLET | Freq: Two times a day (BID) | ORAL | 5 refills | Status: AC

## 2022-09-24 MED ORDER — SUMATRIPTAN SUCCINATE 100 MG PO TABS: 100.0000 mg | ORAL_TABLET | ORAL | 5 refills | Status: AC | PRN

## 2022-09-24 MED ORDER — TRIAMCINOLONE ACETONIDE 0.1 % EX CREA: 1.0000 | TOPICAL_CREAM | Freq: Two times a day (BID) | CUTANEOUS | 2 refills | Status: DC

## 2022-09-24 MED ORDER — OXYCODONE-ACETAMINOPHEN 10-325 MG PO TABS: 1.0000 | ORAL_TABLET | Freq: Four times a day (QID) | ORAL | 0 refills | Status: DC | PRN

## 2022-09-24 NOTE — Progress Notes (Signed)
Subjective:    Patient ID: Marie Tran, female    DOB: [birth year], 52 y.o.   MRN: [id number]  HPI Here for a well exam. She has a few issues to discuss. First she has been trying to lose weight by watching her diet and by walking, but she has never been successful. She notes that she saw GI in 2016 for a fatty liver, and they said the best treatment for this is to lose weight. The workup at that time was negative for any other specific etiologies. We also notes today that her last A1c in 2016 was in the prediabetic range at 6.1%. She has not had any substantive medical workup since that time. She gets regular mammograms. She has never had a colonoscopy. The other issue to discuss is a sharp pain that comes and goes in the RUQ of her abdomen and the right flank. She has never taken anything for this. Her BM's are regular and she denies any urinary symptoms. Eating food has no effect on this pain. She is S/P a cholecystectomy. She is in a pain management program with [place] for chronic low back pain.    Review of Systems  Constitutional: Negative.   HENT: Negative.    Eyes: Negative.   Respiratory: Negative.    Cardiovascular: Negative.   Gastrointestinal:  Positive for abdominal pain.  Genitourinary:  Negative for decreased urine volume, difficulty urinating, dyspareunia, dysuria, enuresis, flank pain, frequency, hematuria, pelvic pain and urgency.  Musculoskeletal:  Positive for back pain.  Skin: Negative.   Neurological: Negative.  Negative for headaches.  Psychiatric/Behavioral: Negative.         Objective:   Physical Exam Constitutional:      General: She is not in acute distress.    Appearance: She is well-developed. She is obese.  HENT:     Head: Normocephalic and atraumatic.     Right Ear: External ear normal.     Left Ear: External ear normal.     Nose: Nose normal.     Mouth/Throat:     Pharynx: No oropharyngeal exudate.  Eyes:     General: No scleral icterus.     Conjunctiva/sclera: Conjunctivae normal.     Pupils: Pupils are equal, round, and reactive to light.  Neck:     Thyroid: No thyromegaly.     Vascular: No JVD.  Cardiovascular:     Rate and Rhythm: Normal rate and regular rhythm.     Heart sounds: Normal heart sounds. No murmur heard.    No friction rub. No gallop.  Pulmonary:     Effort: Pulmonary effort is normal. No respiratory distress.     Breath sounds: Normal breath sounds. No wheezing or rales.     Comments: Thre is no abdominal tenderness, but she is quite tender along the inferior margin of the right sided rib cage. This tenderness extends around the right side to the right side of the thoracic spine.  Chest:     Chest wall: No tenderness.  Abdominal:     General: Bowel sounds are normal. There is no distension.     Palpations: Abdomen is soft. There is no mass.     Tenderness: There is no abdominal tenderness. There is no guarding or rebound.  Musculoskeletal:        General: No tenderness. Normal range of motion.     Cervical back: Normal range of motion and neck supple.  Lymphadenopathy:     Cervical: No cervical adenopathy.  Skin:  General: Skin is warm and dry.     Findings: No erythema or rash.  Neurological:     General: No focal deficit present.     Mental Status: She is alert and oriented to person, place, and time.     Cranial Nerves: No cranial nerve deficit.     Motor: No abnormal muscle tone.     Coordination: Coordination normal.     Deep Tendon Reflexes: Reflexes are normal and symmetric. Reflexes normal.  Psychiatric:        Mood and Affect: Mood normal.        Behavior: Behavior normal.        Thought Content: Thought content normal.        Judgment: Judgment normal.           Assessment & Plan:  Well exam. We discussed diet and exercise. Get fasting labs. If she is still prediabetic, I recommended she start taking Metformin. This would not only lower her glucoses, but it would help her lose  weight as well. She agrees. The pain on her right side appears to be radicular pain from the thoracic spine, so we will get Xrays of her thoracic spine today. Set up a colonoscopy.  [name], MD

## 2022-09-25 ENCOUNTER — Other Ambulatory Visit: Payer: Self-pay

## 2022-09-25 DIAGNOSIS — E039 Hypothyroidism, unspecified: Secondary | ICD-10-CM

## 2022-09-25 DIAGNOSIS — E785 Hyperlipidemia, unspecified: Secondary | ICD-10-CM

## 2022-09-25 MED ORDER — ATORVASTATIN CALCIUM 20 MG PO TABS: 20.0000 mg | ORAL_TABLET | Freq: Every day | ORAL | 3 refills | Status: DC

## 2022-09-25 MED ORDER — LEVOTHYROXINE SODIUM 100 MCG PO TABS: 100.0000 ug | ORAL_TABLET | Freq: Every day | ORAL | 3 refills | Status: DC

## 2022-09-30 ENCOUNTER — Encounter: Payer: Self-pay | Admitting: Family Medicine

## 2022-09-30 NOTE — Telephone Encounter (Signed)
Make an OV for later today

## 2022-10-13 ENCOUNTER — Encounter: Payer: Self-pay | Admitting: Family Medicine

## 2022-10-13 ENCOUNTER — Ambulatory Visit (INDEPENDENT_AMBULATORY_CARE_PROVIDER_SITE_OTHER): Payer: BC Managed Care – PPO | Admitting: Family Medicine

## 2022-10-13 VITALS — BP 124/82 | HR 71 | Temp 98.4°F | Wt 243.0 lb

## 2022-10-13 DIAGNOSIS — F1190 Opioid use, unspecified, uncomplicated: Secondary | ICD-10-CM | POA: Diagnosis not present

## 2022-10-13 DIAGNOSIS — G8929 Other chronic pain: Secondary | ICD-10-CM | POA: Diagnosis not present

## 2022-10-13 DIAGNOSIS — M5440 Lumbago with sciatica, unspecified side: Secondary | ICD-10-CM | POA: Diagnosis not present

## 2022-10-13 MED ORDER — HYDROCODONE-ACETAMINOPHEN 10-325 MG PO TABS: 1.0000 | ORAL_TABLET | Freq: Four times a day (QID) | ORAL | 0 refills | Status: DC | PRN

## 2022-10-13 NOTE — Progress Notes (Signed)
   Subjective:    Patient ID: Marie Tran, female    DOB: 10/26/1970, 52 y.o.   MRN: [id number]  HPI Here for pain management. Her back pain still bothers her every day. She takes sort walks to try to lose weight. Of note we had ordered a lumbar MRI last May, but she says no one ever contacted her about this.    Review of Systems  Constitutional: Negative.   Musculoskeletal:  Positive for back pain.       Objective:   Physical Exam Constitutional:      Appearance: Normal appearance.  Neurological:     Mental Status: She is alert.           Assessment & Plan:  Pain management. Indication for chronic opioid: low back pain Medication and dose: Norco 10-325 # pills per month: 120 Last UDS date: 05-28-22 Opioid Treatment Agreement signed (Y/N): 10-10-18 Opioid Treatment Agreement last reviewed with patient:  10-13-22 [facility] reviewed this encounter (include red flags): Yes Meds were refilled. We will reorder the lumbar spine MRI.  [name], MD

## 2022-11-03 ENCOUNTER — Encounter: Payer: Self-pay | Admitting: Gastroenterology

## 2022-11-03 ENCOUNTER — Ambulatory Visit (AMBULATORY_SURGERY_CENTER): Payer: BC Managed Care – PPO | Admitting: *Deleted

## 2022-11-03 VITALS — Ht 64.0 in | Wt 231.0 lb

## 2022-11-03 DIAGNOSIS — Z1211 Encounter for screening for malignant neoplasm of colon: Secondary | ICD-10-CM

## 2022-11-03 MED ORDER — NA SULFATE-K SULFATE-MG SULF 17.5-3.13-1.6 GM/177ML PO SOLN: 1.0000 | Freq: Once | ORAL | 0 refills | Status: AC

## 2022-11-03 NOTE — Progress Notes (Signed)

## 2022-11-23 IMAGING — MG MM DIGITAL SCREENING BILAT W/ TOMO AND CAD
6 of 10 series · 6 of 30 positions shown · non-contrast
Comparison: Previous exam(s).

CLINICAL DATA: Screening.

EXAM:
DIGITAL SCREENING BILATERAL MAMMOGRAM WITH TOMOSYNTHESIS AND CAD
TECHNIQUE: Bilateral screening digital craniocaudal and mediolateral oblique
mammograms were obtained. Bilateral screening digital breast
tomosynthesis was performed. The images were evaluated with
computer-aided detection.

[L MLO synth-2D]
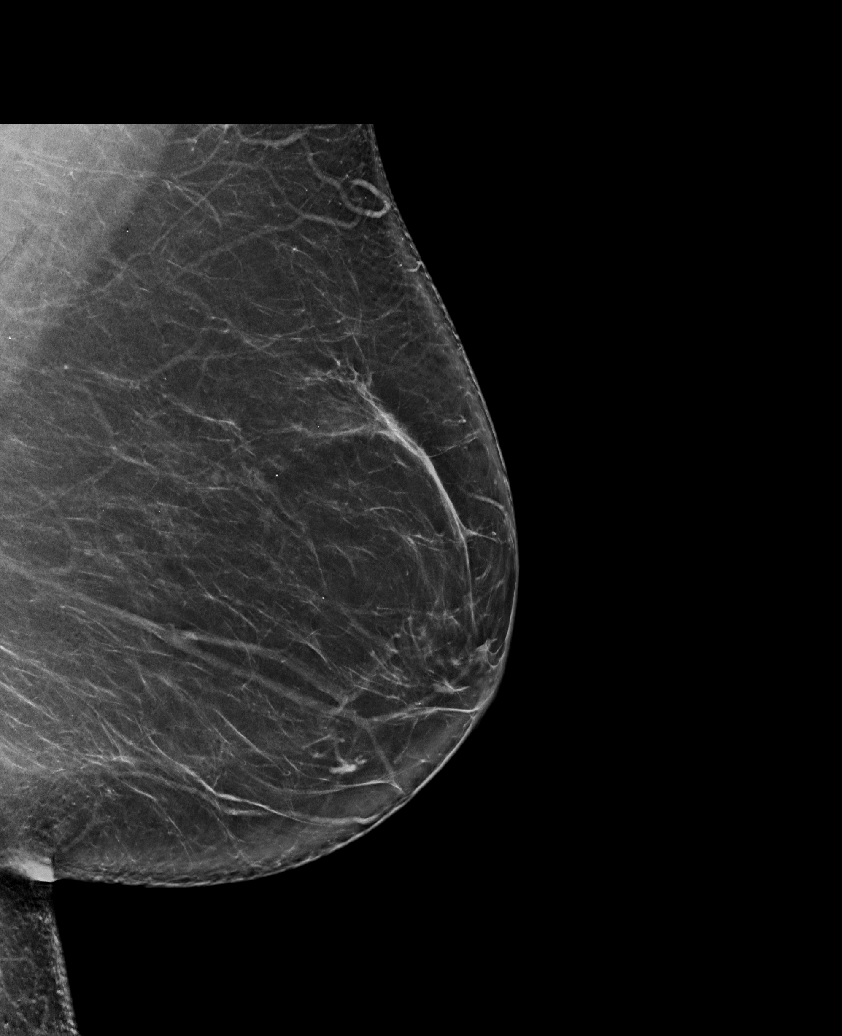

[L CC synth-2D]
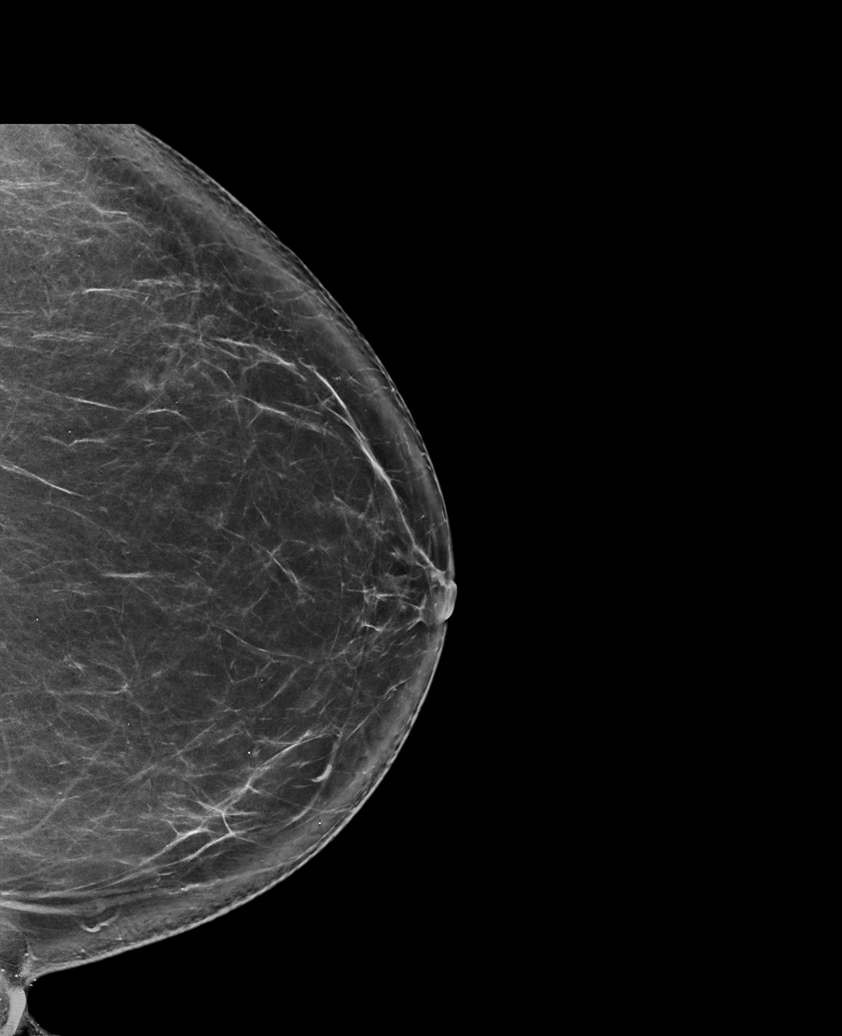

[R MLO synth-2D]
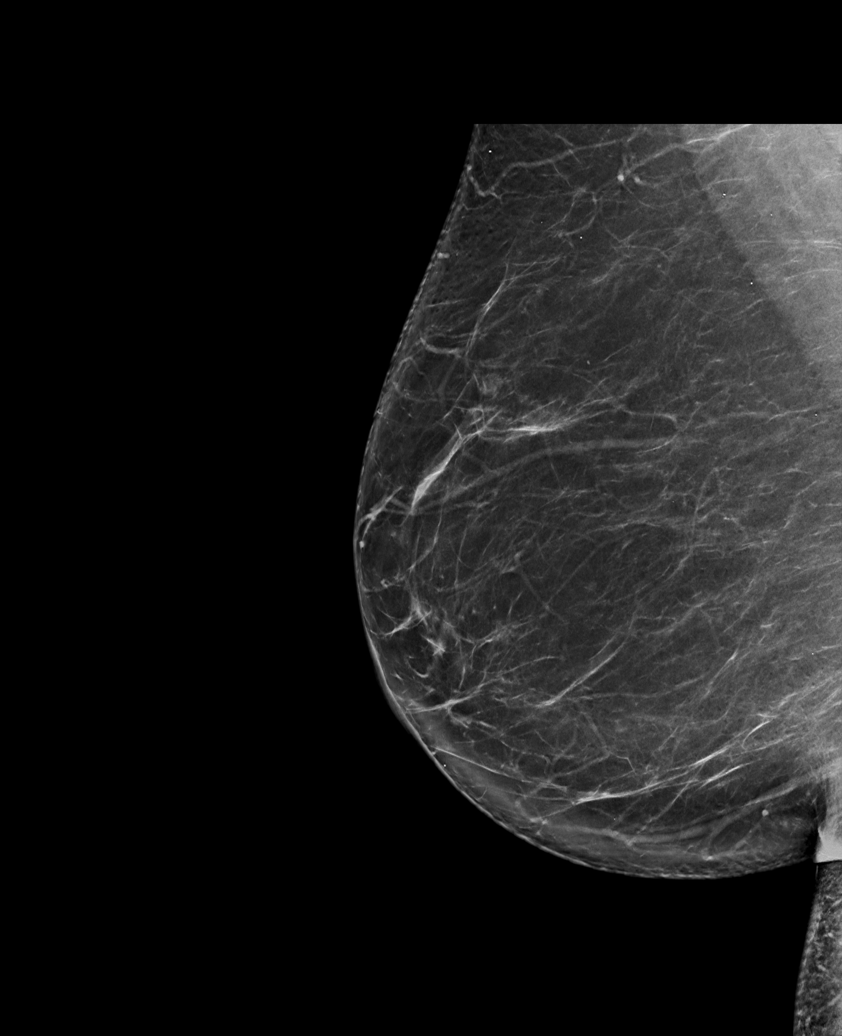

[R XCCL synth-2D]
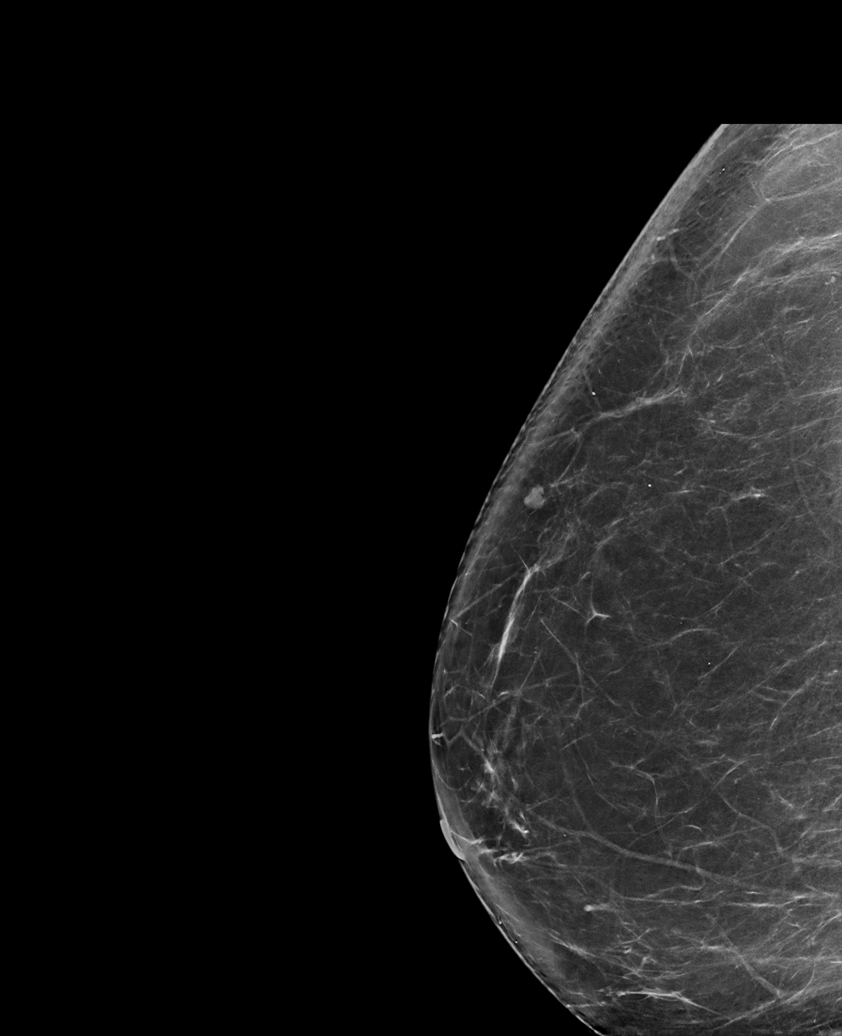

[R CC synth-2D]
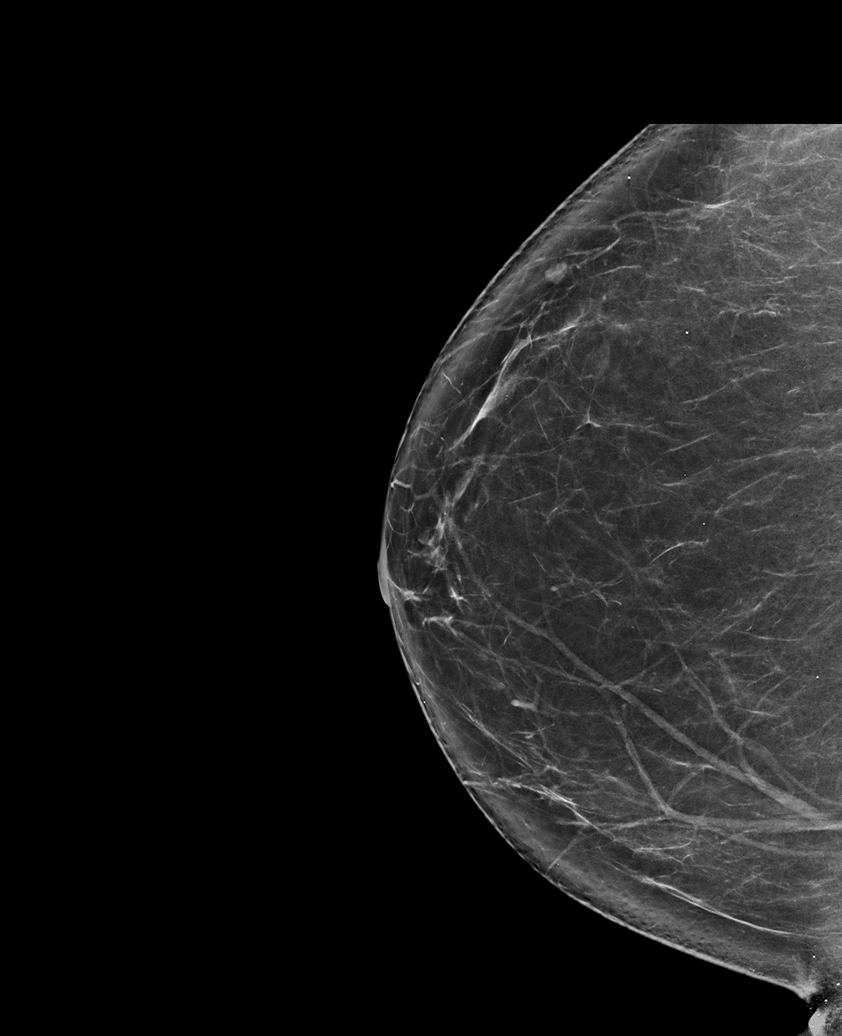

[R CC tomo · tomo slice 44/87.0]
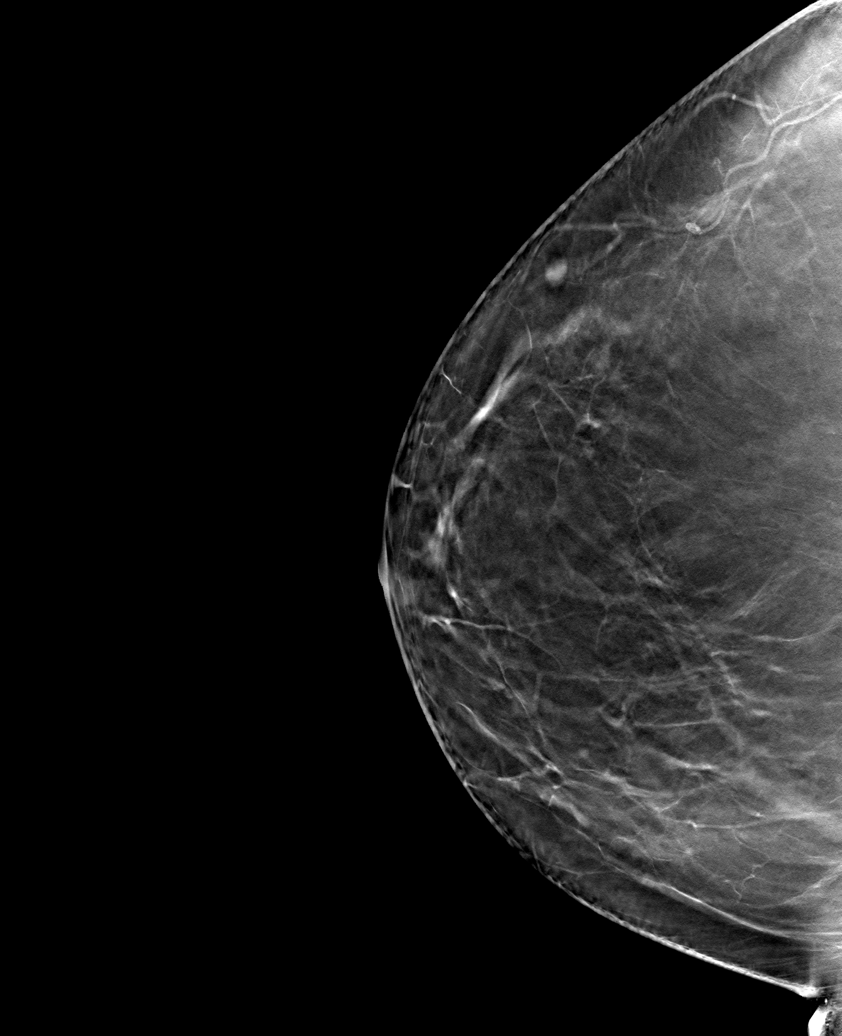

[6 of 30 positions shown; findings below may reference images not displayed]

ACR Breast Density Category b: There are scattered areas of
fibroglandular density.
FINDINGS: There are no findings suspicious for malignancy.
IMPRESSION: No mammographic evidence of malignancy. A result letter of this
screening mammogram will be mailed directly to the patient.

RECOMMENDATION:
Screening mammogram in one year. (Code:51-O-LD2)

BI-RADS CATEGORY  1: Negative.

## 2022-11-24 ENCOUNTER — Encounter: Payer: Self-pay | Admitting: Gastroenterology

## 2022-11-24 ENCOUNTER — Ambulatory Visit (AMBULATORY_SURGERY_CENTER): Payer: BC Managed Care – PPO | Admitting: Gastroenterology

## 2022-11-24 VITALS — BP 147/85 | HR 82 | Temp 98.6°F | Resp 13 | Ht 64.0 in | Wt 231.0 lb

## 2022-11-24 DIAGNOSIS — K635 Polyp of colon: Secondary | ICD-10-CM | POA: Diagnosis not present

## 2022-11-24 DIAGNOSIS — D127 Benign neoplasm of rectosigmoid junction: Secondary | ICD-10-CM

## 2022-11-24 DIAGNOSIS — D122 Benign neoplasm of ascending colon: Secondary | ICD-10-CM

## 2022-11-24 DIAGNOSIS — D124 Benign neoplasm of descending colon: Secondary | ICD-10-CM | POA: Diagnosis not present

## 2022-11-24 DIAGNOSIS — D120 Benign neoplasm of cecum: Secondary | ICD-10-CM | POA: Diagnosis not present

## 2022-11-24 DIAGNOSIS — Z1211 Encounter for screening for malignant neoplasm of colon: Secondary | ICD-10-CM | POA: Diagnosis not present

## 2022-11-24 HISTORY — PX: COLONOSCOPY: SHX174

## 2022-11-24 MED ORDER — SODIUM CHLORIDE 0.9 % IV SOLN: 500.0000 mL | Freq: Once | INTRAVENOUS | Status: DC

## 2022-11-24 NOTE — Op Note (Signed)
[facility] Patient Name: Marie Tran Procedure Date: 11/24/2022 8:32 AM MRN: [id number] Endoscopist: Viviann Spare P. Adela Lank , MD, [id number] Age: 52 Referring MD:  Date of Birth: Dec 25, 1970 Gender: Female Account #: [id number] Procedure:                Colonoscopy Indications:              Screening for colorectal malignant neoplasm, This                            is the patient's first colonoscopy Medicines:                Monitored Anesthesia Care Procedure:                Pre-Anesthesia Assessment:                           - Prior to the procedure, a History and Physical                            was performed, and patient medications and                            allergies were reviewed. The patient's tolerance of                            previous anesthesia was also reviewed. The risks                            and benefits of the procedure and the sedation                            options and risks were discussed with the patient.                            All questions were answered, and informed consent                            was obtained. Prior Anticoagulants: The patient has                            taken no anticoagulant or antiplatelet agents. ASA                            Grade Assessment: II - A patient with mild systemic                            disease. After reviewing the risks and benefits,                            the patient was deemed in satisfactory condition to                            undergo the procedure.  After obtaining informed consent, the colonoscope                            was passed under direct vision. Throughout the                            procedure, the patient's blood pressure, pulse, and                            oxygen saturations were monitored continuously. The                            Olympus CF-HQ190L SN [id number] was introduced through                            the anus and  advanced to the the cecum, identified                            by appendiceal orifice and ileocecal valve. The                            colonoscopy was performed without difficulty. The                            patient tolerated the procedure well. The quality                            of the bowel preparation was good. The ileocecal                            valve, appendiceal orifice, and rectum were                            photographed. Scope In: 8:33:57 AM Scope Out: 8:53:06 AM Scope Withdrawal Time: 0 hours 15 minutes 42 seconds  Total Procedure Duration: 0 hours 19 minutes 9 seconds  Findings:                 The perianal and digital rectal examinations were                            normal.                           A diminutive polyp was found in the cecum. The                            polyp was sessile. The polyp was removed with a                            cold snare. Resection and retrieval were complete.                           A 3 mm polyp was found in the ascending colon. The  polyp was sessile. The polyp was removed with a                            cold snare. Resection and retrieval were complete.                           A 3 mm polyp was found in the descending colon. The                            polyp was sessile. The polyp was removed with a                            cold snare. Resection and retrieval were complete.                           An 8 mm polyp was found in the recto-sigmoid colon.                            The polyp was pedunculated. The polyp was removed                            with a hot snare. Resection and retrieval were                            complete.                           Multiple small-mouthed diverticula were found in                            the sigmoid colon.                           Internal hemorrhoids were found during                            retroflexion. The hemorrhoids were  small.                           The exam was otherwise without abnormality. Complications:            No immediate complications. Estimated blood loss:                            Minimal. Estimated Blood Loss:     Estimated blood loss was minimal. Impression:               - One diminutive polyp in the cecum, removed with a                            cold snare. Resected and retrieved.                           - One 3 mm polyp in the ascending colon, removed  with a cold snare. Resected and retrieved.                           - One 3 mm polyp in the descending colon, removed                            with a cold snare. Resected and retrieved.                           - One 8 mm polyp at the recto-sigmoid colon,                            removed with a hot snare. Resected and retrieved.                           - Diverticulosis in the sigmoid colon.                           - Internal hemorrhoids.                           - The examination was otherwise normal.                           - The GI Genius (intelligent endoscopy module),                            computer-aided polyp detection system powered by AI                            was utilized to detect colorectal polyps through                            enhanced visualization during colonoscopy. Recommendation:           - Patient has a contact number available for                            emergencies. The signs and symptoms of potential                            delayed complications were discussed with the                            patient. Return to normal activities tomorrow.                            Written discharge instructions were provided to the                            patient.                           - Resume previous diet.                           -  Continue present medications.                           - Await pathology results.                           - No ibuprofen,  naproxen, or other non-steroidal                            anti-inflammatory drugs for 2 weeks after polyp                            removal. Viviann SpareSteven P. Pierrette Scheu, MD 11/24/2022 8:57:12 AM This report has been signed electronically.

## 2022-11-24 NOTE — Progress Notes (Signed)
Vss nad trans to pacu 

## 2022-11-24 NOTE — Progress Notes (Signed)
[facility] History and Physical   Primary Care Physician:  Nelwyn SalisburyFry, Stephen A, MD   Reason for Procedure:   Colon cancer screening  Plan:    colonoscopy     HPI: Marie Tran is a 52 y.o. female  here for colonoscopy screening - first time exam.   Patient denies any significant bowel symptoms at this time. No family history of colon cancer known in first degree relatives (does have an aunt who had CRC). Otherwise feels well without any cardiopulmonary symptoms.   I have discussed risks / benefits of anesthesia and endoscopic procedure with Marie Tran and they wish to proceed with the exams as outlined today.    Past Medical History:  Diagnosis Date   Anemia    Breast lump    right breast, UOQ, intramammary lymph node, also left breast cyst    Bronchitis    Complication of anesthesia    hard to wake up after lap choli   Fatty liver disease, nonalcoholic    GERD (gastroesophageal reflux disease)    Hoarseness    Hyperlipidemia    Hypothyroidism    Migraines    Neck pain, chronic    sees Dr. Estill BambergMark Dumonski   Obesity    Overactive bladder    PONV (postoperative nausea and vomiting)    had to have scope patch last surgery   Snores    Wears dentures    top    Past Surgical History:  Procedure Laterality Date   CERVICAL DISC SURGERY  10/15/2012   at [facility] per Dr. Yevette Edwardsumonski    CHOLECYSTECTOMY  08/18/2007   FOOT SURGERY Right    x 2   ROTATOR CUFF REPAIR W/ DISTAL CLAVICLE EXCISION Right 09/05/2013   per Dr. Ave Filterhandler    TUBAL LIGATION      Prior to Admission medications   Medication Sig Start Date End Date Taking? Authorizing Provider  Ascorbic Acid (VITAMIN C) 1000 MG tablet Take 1,000 mg by mouth daily.   Yes [provider]  atorvastatin (LIPITOR) 20 MG tablet Take 1 tablet (20 mg total) by mouth daily. 09/25/22  Yes Nelwyn SalisburyFry, Stephen A, MD  HYDROcodone-acetaminophen (NORCO) 10-325 MG tablet Take 1 tablet by mouth every 6 (six)  hours as needed for moderate pain. 12/12/22 01/11/23 Yes Nelwyn SalisburyFry, Stephen A, MD  levothyroxine (SYNTHROID) 100 MCG tablet Take 1 tablet (100 mcg total) by mouth daily. 09/25/22  Yes Nelwyn SalisburyFry, Stephen A, MD  levothyroxine (SYNTHROID) 200 MCG tablet Take 1 tablet (200 mcg total) by mouth daily before breakfast. 09/24/22  Yes Nelwyn SalisburyFry, Stephen A, MD  albuterol (VENTOLIN HFA) 108 (90 Base) MCG/ACT inhaler Inhale 2 puffs into the lungs every 6 (six) hours as needed for wheezing or shortness of breath. Patient not taking: Reported on 11/03/2022 07/07/21   Eden Emmsable, James M, NP  fluticasone [facility](FLONASE) 50 MCG/ACT nasal spray Place 2 sprays into both nostrils daily. Patient not taking: Reported on 11/03/2022 07/07/21   Eden Emmsable, James M, NP  mirtazapine (REMERON) 15 MG tablet Take 1 tablet (15 mg total) by mouth at bedtime. Patient not taking: Reported on 11/03/2022 05/28/22   Nelwyn SalisburyFry, Stephen A, MD  omeprazole (PRILOSEC) 40 MG capsule TAKE 1 CAPSULE (40 MG TOTAL) BY MOUTH EVERY EVENING. 05/28/22   Nelwyn SalisburyFry, Stephen A, MD  SUMAtriptan (IMITREX) 100 MG tablet Take 1 tablet (100 mg total) by mouth as needed for migraine. May repeat in 2 hours if headache persists or recurs. Patient not taking: Reported on 11/03/2022 09/24/22  Nelwyn Salisbury, MD  triamcinolone cream (KENALOG) 0.1 % Apply 1 Application topically 2 (two) times daily. 09/24/22   Nelwyn Salisbury, MD  valACYclovir (VALTREX) 1000 MG tablet Take 1 tablet (1,000 mg total) by mouth 2 (two) times daily. Patient not taking: Reported on 11/03/2022 09/24/22   Nelwyn Salisbury, MD    Current Outpatient Medications  Medication Sig Dispense Refill   Ascorbic Acid (VITAMIN C) 1000 MG tablet Take 1,000 mg by mouth daily.     atorvastatin (LIPITOR) 20 MG tablet Take 1 tablet (20 mg total) by mouth daily. 90 tablet 3   [START ON 12/12/2022] HYDROcodone-acetaminophen (NORCO) 10-325 MG tablet Take 1 tablet by mouth every 6 (six) hours as needed for moderate pain. 120 tablet 0   levothyroxine (SYNTHROID) 100  MCG tablet Take 1 tablet (100 mcg total) by mouth daily. 90 tablet 3   levothyroxine (SYNTHROID) 200 MCG tablet Take 1 tablet (200 mcg total) by mouth daily before breakfast. 90 tablet 3   albuterol (VENTOLIN HFA) 108 (90 Base) MCG/ACT inhaler Inhale 2 puffs into the lungs every 6 (six) hours as needed for wheezing or shortness of breath. (Patient not taking: Reported on 11/03/2022) 8 g 0   fluticasone (FLONASE) 50 MCG/ACT nasal spray Place 2 sprays into both nostrils daily. (Patient not taking: Reported on 11/03/2022) 16 g 0   mirtazapine (REMERON) 15 MG tablet Take 1 tablet (15 mg total) by mouth at bedtime. (Patient not taking: Reported on 11/03/2022) 30 tablet 2   omeprazole (PRILOSEC) 40 MG capsule TAKE 1 CAPSULE (40 MG TOTAL) BY MOUTH EVERY EVENING. 90 capsule 3   SUMAtriptan (IMITREX) 100 MG tablet Take 1 tablet (100 mg total) by mouth as needed for migraine. May repeat in 2 hours if headache persists or recurs. (Patient not taking: Reported on 11/03/2022) 10 tablet 5   triamcinolone cream (KENALOG) 0.1 % Apply 1 Application topically 2 (two) times daily. 45 g 2   valACYclovir (VALTREX) 1000 MG tablet Take 1 tablet (1,000 mg total) by mouth 2 (two) times daily. (Patient not taking: Reported on 11/03/2022) 10 tablet 5   Current Facility-Administered Medications  Medication Dose Route Frequency Provider Last Rate Last Admin   0.9 %  sodium chloride infusion  500 mL Intravenous Once Lovis More, Willaim Rayas, MD        Allergies as of 11/24/2022 - Review Complete 11/24/2022  Allergen Reaction Noted   Meloxicam Other (See Comments) 05/28/2022    Family History  Problem Relation Age of Onset   Ulcerative colitis Mother    Cancer Mother        breast   Colon polyps Mother    Bladder Cancer Father    Leukemia Father    Colon polyps Father    Colon polyps Sister    Colon polyps Brother    Cancer Brother        brain   Brain cancer Brother    Colon cancer Maternal Aunt        found at age    Hypertension Other    Cancer Other        ovarian,uterine,breast   Stroke Other        female 1st degree relative <50   Crohn's disease Neg Hx    Esophageal cancer Neg Hx    Rectal cancer Neg Hx    Stomach cancer Neg Hx     Social History   Socioeconomic History   Marital status: Married    Spouse name: Not  on file   Number of children: 3   Years of education: Not on file   Highest education level: Not on file  Occupational History   Not on file  Tobacco Use   Smoking status: Former    Types: Cigarettes    Quit date: 09/08/1988    Years since quitting: 34.2   Smokeless tobacco: Never  Vaping Use   Vaping Use: Never used  Substance and Sexual Activity   Alcohol use: No    Alcohol/week: 0.0 standard drinks of alcohol   Drug use: No   Sexual activity: Not on file  Other Topics Concern   Not on file  Social History Narrative   Not on file   Social Determinants of Health   Financial Resource Strain: Not on file  Food Insecurity: Not on file  Transportation Needs: Not on file  Physical Activity: Not on file  Stress: Not on file  Social Connections: Not on file  Intimate Partner Violence: Not on file    Review of Systems: All other review of systems negative except as mentioned in the HPI.  Physical Exam: Vital signs BP 117/77   Pulse 85   Temp 98.6 F (37 C)   Ht 5\' 4"  (1.626 m)   Wt 231 lb (104.8 kg)   LMP 09/04/2015   SpO2 100%   BMI 39.65 kg/m   General:   Alert,  Well-developed, pleasant and cooperative in NAD Lungs:  Clear throughout to auscultation.   Heart:  Regular rate and rhythm Abdomen:  Soft, nontender and nondistended.   Neuro/Psych:  Alert and cooperative. Normal mood and affect. A and O x 3  Harlin Rain, MD [facility]

## 2022-11-24 NOTE — Patient Instructions (Addendum)
Continue present medications. Await pathology results. No ibuprofen, naproxen, or other non-steroidal anti-inflammatory drugs for 2 weeks after polyp removal.  Please read over handouts about polyps, diverticulosis and hemorrhoids                                                      YOU HAD AN ENDOSCOPIC PROCEDURE TODAY AT THE [facility]:   Refer to the procedure report that was given to you for any specific questions about what was found during the examination.  If the procedure report does not answer your questions, please call your gastroenterologist to clarify.  If you requested that your care partner not be given the details of your procedure findings, then the procedure report has been included in a sealed envelope for you to review at your convenience later.  YOU SHOULD EXPECT: Some feelings of bloating in the abdomen. Passage of more gas than usual.  Walking can help get rid of the air that was put into your GI tract during the procedure and reduce the bloating. If you had a lower endoscopy (such as a colonoscopy or flexible sigmoidoscopy) you may notice spotting of blood in your stool or on the toilet paper. If you underwent a bowel prep for your procedure, you may not have a normal bowel movement for a few days.  Please Note:  You might notice some irritation and congestion in your nose or some drainage.  This is from the oxygen used during your procedure.  There is no need for concern and it should clear up in a day or so.  SYMPTOMS TO REPORT IMMEDIATELY:  Following lower endoscopy (colonoscopy or flexible sigmoidoscopy):  Excessive amounts of blood in the stool  Significant tenderness or worsening of abdominal pains  Swelling of the abdomen that is new, acute  Fever of 100F or higher  For urgent or emergent issues, a gastroenterologist can be reached at any hour by calling [phone]. Do not use MyChart messaging for urgent concerns.    DIET:  We do recommend a  small meal at first, but then you may proceed to your regular diet.  Drink plenty of fluids but you should avoid alcoholic beverages for 24 hours.  ACTIVITY:  You should plan to take it easy for the rest of today and you should NOT DRIVE or use heavy machinery until tomorrow (because of the sedation medicines used during the test).    FOLLOW UP: Our staff will call the number listed on your records the next business day following your procedure.  We will call around 7:15- 8:00 am to check on you and address any questions or concerns that you may have regarding the information given to you following your procedure. If we do not reach you, we will leave a message.     If any biopsies were taken you will be contacted by phone or by letter within the next 1-3 weeks.  Please call us at [phone] if you have not heard about the biopsies in 3 weeks.    SIGNATURES/CONFIDENTIALITY: You and/or your care partner have signed paperwork which will be entered into your electronic medical record.  These signatures attest to the fact that that the information above on your After Visit Summary has been reviewed and is understood.  Full responsibility of the confidentiality of this  discharge information lies with you and/or your care-partner.

## 2022-11-25 ENCOUNTER — Telehealth: Payer: Self-pay

## 2022-11-25 NOTE — Telephone Encounter (Signed)
  Follow up Call-     11/24/2022    7:59 AM  Call back number  Post procedure Call Back phone  # [phone]  Permission to leave phone message Yes     Patient questions:  Do you have a fever, pain , or abdominal swelling? No. Pain Score  0 *  Have you tolerated food without any problems? Yes.    Have you been able to return to your normal activities? Yes.    Do you have any questions about your discharge instructions: Diet   No. Medications  No. Follow up visit  No.  Do you have questions or concerns about your Care? No.  Actions: * If pain score is 4 or above: No action needed, pain <4.

## 2022-12-03 ENCOUNTER — Telehealth: Payer: Self-pay | Admitting: Family Medicine

## 2022-12-03 NOTE — Telephone Encounter (Signed)
None of the polyps he took out were cancerous, but a few of them were precancerous. He has advised that she have another colonoscopy in 5 years.

## 2022-12-03 NOTE — Telephone Encounter (Signed)
Pt had a colonoscopy done on 11/24/22 and has not had anyone reach out to her to discuss the results yet. She wanted to know if Dr. Clent Ridges would be able to review and go over the results with her.

## 2022-12-04 NOTE — Telephone Encounter (Signed)
Pt notified of of Dr Clent Ridges advise on pt colonoscopy report, pt verbalized undeerstanding

## 2022-12-07 ENCOUNTER — Ambulatory Visit (INDEPENDENT_AMBULATORY_CARE_PROVIDER_SITE_OTHER): Payer: BC Managed Care – PPO | Admitting: Family Medicine

## 2022-12-07 ENCOUNTER — Encounter: Payer: Self-pay | Admitting: Family Medicine

## 2022-12-07 VITALS — BP 120/80 | HR 82 | Temp 98.0°F | Wt 237.0 lb

## 2022-12-07 DIAGNOSIS — T63301A Toxic effect of unspecified spider venom, accidental (unintentional), initial encounter: Secondary | ICD-10-CM | POA: Diagnosis not present

## 2022-12-07 MED ORDER — CEPHALEXIN 500 MG PO CAPS: 500.0000 mg | ORAL_CAPSULE | Freq: Three times a day (TID) | ORAL | 0 refills | Status: DC

## 2022-12-07 NOTE — Progress Notes (Signed)
   Subjective:    Patient ID: Marie Tran, female    DOB: 06-09-1971, 52 y.o.   MRN: [id number]  HPI Here for mild pain in front of and behind both ears that started 3 days ago. At the same time she noticed a sore red spot on her scalp. Since then the red spot has been drying up. She feels fine in general, no fever. She notes that the night before this started she was outside sitting on a grandstand  to watch a car race.    Review of Systems  Constitutional: Negative.   HENT:  Positive for ear pain. Negative for congestion, facial swelling, postnasal drip, sinus pressure and sore throat.   Eyes: Negative.   Respiratory: Negative.    Skin:  Positive for rash.       Objective:   Physical Exam Constitutional:      Appearance: Normal appearance. She is not ill-appearing.  HENT:     Right Ear: Tympanic membrane, ear canal and external ear normal.     Left Ear: Tympanic membrane, ear canal and external ear normal.     Ears:     Comments: There are a few small tender preauricular lymph nodes on both sides that are slightly enlarged     Nose: Nose normal.     Mouth/Throat:     Pharynx: Oropharynx is clear.  Eyes:     Conjunctiva/sclera: Conjunctivae normal.  Pulmonary:     Effort: Pulmonary effort is normal.     Breath sounds: Normal breath sounds.  Lymphadenopathy:     Cervical: No cervical adenopathy.  Skin:    Comments: There is a macular erythematous area on the left upper forehead that is scabbed over, this is not tender  Neurological:     Mental Status: She is alert.           Assessment & Plan:  She likely had a spider bite to the scalp, and she has some reactive lymph nodes from this. We will cover with 7 days of Keflex. Recheck as needed.  Gershon Crane, MD

## 2022-12-28 ENCOUNTER — Encounter: Payer: Self-pay | Admitting: Family Medicine

## 2022-12-28 ENCOUNTER — Ambulatory Visit (INDEPENDENT_AMBULATORY_CARE_PROVIDER_SITE_OTHER): Payer: BC Managed Care – PPO | Admitting: Family Medicine

## 2022-12-28 VITALS — BP 122/78 | HR 82 | Temp 98.3°F | Wt 230.0 lb

## 2022-12-28 DIAGNOSIS — S8002XA Contusion of left knee, initial encounter: Secondary | ICD-10-CM

## 2022-12-28 MED ORDER — METHYLPREDNISOLONE 4 MG PO TBPK
ORAL_TABLET | ORAL | 0 refills | Status: DC
Start: 2022-12-28 — End: 2023-01-06

## 2022-12-28 NOTE — Progress Notes (Signed)
   Subjective:    Patient ID: Marie Tran, female    DOB: 1970-09-25, 52 y.o.   MRN: [id number]  HPI Here for an injury to the left knee that occurred at home 2 and 1/2 weeks ago. While going up steps into a building, a step broke causing her to fall forward, and she landed on her left knee. It swelled immediately, and it has been painful since then. She is icing it several times a day. She is already on Norco 10-325 QID as part of a pain management program with [place]. She has been supplementing this with Ibuprofen 600 mg.    Review of Systems  Constitutional: Negative.   Respiratory: Negative.    Cardiovascular: Negative.   Musculoskeletal:  Positive for arthralgias.       Objective:   Physical Exam Constitutional:      Comments: Walks with a slight limp  Cardiovascular:     Rate and Rhythm: Normal rate and regular rhythm.     Pulses: Normal pulses.     Heart sounds: Normal heart sounds.  Pulmonary:     Effort: Pulmonary effort is normal.     Breath sounds: Normal breath sounds.  Musculoskeletal:     Comments: Left knee is swollen, not warm. No ecchymoses. She is tender along the medial joint space. ROM is limited by pain to both flexion and extension. No crepitus   Neurological:     Mental Status: She is alert.           Assessment & Plan:  Left knee contusion. She will stay off the knee and ice it. Refer to [employer]. Given a Medrol dose pack to reduce inflammation.  Gershon Crane, MD

## 2022-12-31 ENCOUNTER — Other Ambulatory Visit: Payer: BC Managed Care – PPO

## 2022-12-31 DIAGNOSIS — M25562 Pain in left knee: Secondary | ICD-10-CM | POA: Diagnosis not present

## 2022-12-31 DIAGNOSIS — E039 Hypothyroidism, unspecified: Secondary | ICD-10-CM

## 2022-12-31 DIAGNOSIS — E785 Hyperlipidemia, unspecified: Secondary | ICD-10-CM

## 2022-12-31 LAB — LIPID PANEL
Cholesterol: 135 mg/dL (ref 0–200)
HDL: 42.7 mg/dL (ref >39.00)
LDL Cholesterol: 70 mg/dL (ref 0–99)
NonHDL: 91.83
Total CHOL/HDL Ratio: 3
Triglycerides: 109 mg/dL (ref 0.0–149.0)
VLDL: 21.8 mg/dL (ref 0.0–40.0)

## 2022-12-31 LAB — T3, FREE: T3, Free: 4.8 pg/mL — ABNORMAL HIGH (ref 2.3–4.2)

## 2022-12-31 LAB — T4, FREE: Free T4: 2.31 ng/dL — ABNORMAL HIGH (ref 0.60–1.60)

## 2022-12-31 LAB — TSH: TSH: 0 u[IU]/mL — ABNORMAL LOW (ref 0.35–5.50)

## 2022-12-31 NOTE — Addendum Note (Signed)
Addended by: Donald Pore A on: 12/31/2022 07:46 AM   Modules accepted: Orders

## 2023-01-01 ENCOUNTER — Other Ambulatory Visit: Payer: Self-pay

## 2023-01-01 DIAGNOSIS — E039 Hypothyroidism, unspecified: Secondary | ICD-10-CM

## 2023-01-01 MED ORDER — LEVOTHYROXINE SODIUM 50 MCG PO TABS: 50.0000 ug | ORAL_TABLET | Freq: Every day | ORAL | 3 refills | Status: DC

## 2023-01-06 ENCOUNTER — Encounter: Payer: Self-pay | Admitting: Family Medicine

## 2023-01-06 ENCOUNTER — Ambulatory Visit (INDEPENDENT_AMBULATORY_CARE_PROVIDER_SITE_OTHER): Payer: BC Managed Care – PPO | Admitting: Family Medicine

## 2023-01-06 VITALS — BP 120/78 | HR 80 | Temp 98.3°F | Wt 226.0 lb

## 2023-01-06 DIAGNOSIS — F1190 Opioid use, unspecified, uncomplicated: Secondary | ICD-10-CM

## 2023-01-06 DIAGNOSIS — G8929 Other chronic pain: Secondary | ICD-10-CM

## 2023-01-06 DIAGNOSIS — M5440 Lumbago with sciatica, unspecified side: Secondary | ICD-10-CM

## 2023-01-06 MED ORDER — HYDROCODONE-ACETAMINOPHEN 10-325 MG PO TABS: 1.0000 | ORAL_TABLET | Freq: Four times a day (QID) | ORAL | 0 refills | Status: DC | PRN

## 2023-01-06 NOTE — Progress Notes (Signed)
   Subjective:    Patient ID: Marie Tran, female    DOB: December 13, 1970, 52 y.o.   MRN: [id number]  HPI Here for pain management. Her back pain is still a problem.    Review of Systems  Constitutional: Negative.   Musculoskeletal:  Positive for back pain.       Objective:   Physical Exam Constitutional:      Appearance: Normal appearance.  Neurological:     Mental Status: She is alert.           Assessment & Plan:  Pain management. Indication for chronic opioid: low back pain Medication and dose: Norco 10-325 # pills per month: 120 Last UDS date: 05-28-22 Opioid Treatment Agreement signed (Y/N): 10-10-18 Opioid Treatment Agreement last reviewed with patient:  01-06-23 [facility] reviewed this encounter (include red flags): Yes Meds were refilled. We will set up another lumbar spine MRI. Gershon Crane, MD

## 2023-02-05 ENCOUNTER — Telehealth: Payer: Self-pay | Admitting: Family Medicine

## 2023-02-05 ENCOUNTER — Encounter: Payer: Self-pay | Admitting: Family Medicine

## 2023-02-05 NOTE — Telephone Encounter (Signed)
Requesting detailed notes from 01/06/23 PMV regarding patient's lumbar spine condition, needing PA for MRI of lumbar spine, insurance requesting more details. Info can be faxed to [phone]

## 2023-02-08 ENCOUNTER — Other Ambulatory Visit: Payer: BC Managed Care – PPO

## 2023-03-11 ENCOUNTER — Encounter: Payer: Self-pay | Admitting: Family Medicine

## 2023-03-11 ENCOUNTER — Ambulatory Visit: Payer: BC Managed Care – PPO | Admitting: Family Medicine

## 2023-03-11 VITALS — BP 120/78 | HR 91 | Temp 98.1°F | Wt 222.0 lb

## 2023-03-11 DIAGNOSIS — F1190 Opioid use, unspecified, uncomplicated: Secondary | ICD-10-CM | POA: Diagnosis not present

## 2023-03-11 DIAGNOSIS — G8929 Other chronic pain: Secondary | ICD-10-CM

## 2023-03-11 DIAGNOSIS — M5440 Lumbago with sciatica, unspecified side: Secondary | ICD-10-CM

## 2023-03-11 MED ORDER — HYDROCODONE-ACETAMINOPHEN 10-325 MG PO TABS: 1.0000 | ORAL_TABLET | Freq: Four times a day (QID) | ORAL | 0 refills | Status: DC | PRN

## 2023-03-11 NOTE — Progress Notes (Signed)
   Subjective:    Patient ID: Marie Tran, female    DOB: [birth year], 52 y.o.   MRN: [id number]  HPI Here for pain management. She is doing about the same. She asks to see Dr. Leia Alf again.    Review of Systems  Constitutional: Negative.   Musculoskeletal:  Positive for back pain.       Objective:   Physical Exam Constitutional:      Appearance: Normal appearance.  Neurological:     Mental Status: She is alert.           Assessment & Plan:  Pain management. Indication for chronic opioid: low back pain Medication and dose: Norco 10-325 # pills per month: 120 Last UDS date: 05-28-22 Opioid Treatment Agreement signed (Y/N): 10-10-18 Opioid Treatment Agreement last reviewed with patient:  03-11-23 [facility] reviewed this encounter (include red flags): Yes Meds were refilled. Gershon Crane, MD

## 2023-04-02 ENCOUNTER — Other Ambulatory Visit (INDEPENDENT_AMBULATORY_CARE_PROVIDER_SITE_OTHER): Payer: BC Managed Care – PPO

## 2023-04-02 DIAGNOSIS — E039 Hypothyroidism, unspecified: Secondary | ICD-10-CM

## 2023-04-02 LAB — T3, FREE: T3, Free: 5.3 pg/mL — ABNORMAL HIGH (ref 2.3–4.2)

## 2023-04-02 LAB — TSH: TSH: <0.01 u[IU]/mL — ABNORMAL LOW (ref 0.35–5.50)

## 2023-04-02 LAB — T4, FREE: Free T4: 2.75 ng/dL — ABNORMAL HIGH (ref 0.60–1.60)

## 2023-04-02 MED ORDER — LEVOTHYROXINE SODIUM 100 MCG PO TABS: 100.0000 ug | ORAL_TABLET | Freq: Every day | ORAL | 0 refills | Status: DC

## 2023-04-02 NOTE — Addendum Note (Signed)
Addended by: Johnella Moloney on: 04/02/2023 03:41 PM   Modules accepted: Orders

## 2023-05-31 ENCOUNTER — Encounter: Payer: Self-pay | Admitting: Family Medicine

## 2023-05-31 ENCOUNTER — Ambulatory Visit (INDEPENDENT_AMBULATORY_CARE_PROVIDER_SITE_OTHER): Payer: BC Managed Care – PPO | Admitting: Family Medicine

## 2023-05-31 VITALS — BP 128/86 | HR 97 | Temp 98.6°F | Wt 226.0 lb

## 2023-05-31 DIAGNOSIS — F1190 Opioid use, unspecified, uncomplicated: Secondary | ICD-10-CM

## 2023-05-31 DIAGNOSIS — G8929 Other chronic pain: Secondary | ICD-10-CM

## 2023-05-31 DIAGNOSIS — M5440 Lumbago with sciatica, unspecified side: Secondary | ICD-10-CM | POA: Diagnosis not present

## 2023-05-31 MED ORDER — MUPIROCIN 2 % EX OINT: 1.0000 | TOPICAL_OINTMENT | Freq: Two times a day (BID) | CUTANEOUS | 2 refills | Status: AC

## 2023-05-31 MED ORDER — HYDROCODONE-ACETAMINOPHEN 10-325 MG PO TABS: 1.0000 | ORAL_TABLET | Freq: Four times a day (QID) | ORAL | 0 refills | Status: DC | PRN

## 2023-05-31 NOTE — Progress Notes (Signed)
   Subjective:    Patient ID: Marie Tran, female    DOB: Apr 05, 1971, 52 y.o.   MRN: [id number]  HPI Here for pain management. She is doing fairly well. We had referred her to see Dr. Tobias Alexander, but she has been unable to go yet. She has been taking care of her mother who recently had a heart attack.    Review of Systems  Constitutional: Negative.   Musculoskeletal:  Positive for back pain.       Objective:   Physical Exam Neurological:     Mental Status: She is alert.           Assessment & Plan:  Pain management. Indication for chronic opioid: low back pain Medication and dose: Norco 10-325 # pills per month: 120 Last UDS date: 05-31-23 Opioid Treatment Agreement signed (Y/N): 10-10-18 Opioid Treatment Agreement last reviewed with patient:  05-31-23 [facility] reviewed this encounter (include red flags): Yes Meds were refilled.  Gershon Crane, MD

## 2023-06-02 LAB — DRUG MONITOR, PANEL 1, W/CONF, URINE
Amphetamines: NEGATIVE ng/mL (ref <500)
Barbiturates: NEGATIVE ng/mL (ref <300)
Benzodiazepines: NEGATIVE ng/mL (ref <100)
Cocaine Metabolite: NEGATIVE ng/mL (ref <150)
Codeine: NEGATIVE ng/mL (ref <50)
Creatinine: 28.3 mg/dL (ref >=20.0)
Hydrocodone: 302 ng/mL — ABNORMAL HIGH (ref <50)
Hydromorphone: 107 ng/mL — ABNORMAL HIGH (ref <50)
Marijuana Metabolite: NEGATIVE ng/mL (ref <20)
Methadone Metabolite: NEGATIVE ng/mL (ref <100)
Morphine: NEGATIVE ng/mL (ref <50)
Norhydrocodone: 501 ng/mL — ABNORMAL HIGH (ref <50)
Opiates: POSITIVE ng/mL — AB (ref <100)
Oxidant: NEGATIVE ug/mL (ref <200)
Oxycodone: NEGATIVE ng/mL (ref <100)
Phencyclidine: NEGATIVE ng/mL (ref <25)
pH: 6.3 (ref 4.5–9.0)

## 2023-06-02 LAB — DM TEMPLATE

## 2023-06-03 ENCOUNTER — Other Ambulatory Visit: Payer: BC Managed Care – PPO

## 2023-07-09 ENCOUNTER — Other Ambulatory Visit: Payer: Self-pay | Admitting: Family Medicine

## 2023-07-15 ENCOUNTER — Other Ambulatory Visit: Payer: Self-pay | Admitting: Family Medicine

## 2023-08-03 ENCOUNTER — Ambulatory Visit (INDEPENDENT_AMBULATORY_CARE_PROVIDER_SITE_OTHER): Payer: BC Managed Care – PPO | Admitting: Family Medicine

## 2023-08-03 ENCOUNTER — Ambulatory Visit: Payer: BC Managed Care – PPO | Admitting: Family Medicine

## 2023-08-03 ENCOUNTER — Encounter: Payer: Self-pay | Admitting: Family Medicine

## 2023-08-03 ENCOUNTER — Ambulatory Visit: Payer: BC Managed Care – PPO | Admitting: Internal Medicine

## 2023-08-03 VITALS — BP 124/80 | HR 74 | Temp 98.5°F | Wt 230.0 lb

## 2023-08-03 DIAGNOSIS — G4762 Sleep related leg cramps: Secondary | ICD-10-CM

## 2023-08-03 MED ORDER — PHENTERMINE HCL 37.5 MG PO CAPS: 37.5000 mg | ORAL_CAPSULE | ORAL | 1 refills | Status: DC

## 2023-08-03 NOTE — Progress Notes (Signed)
   Subjective:    Patient ID: Marie Tran, female    DOB: 04-05-1971, 52 y.o.   MRN: [id number]  HPI Here for a deep bruise on the left calf that appeared 3 days ago after she had a bad cramp. She has frequent cramps in the legs, usually in the calves but sometimes in the thighs. These usually occur at night. She drinks plenty of water every day. She says that 3 nights ago she was awakened but a particularly painful cramp in the left calf. She was able to make this stop by walking around and stretching the leg. No swelling in the feet.    Review of Systems  Constitutional: Negative.   Respiratory: Negative.    Cardiovascular: Negative.   Musculoskeletal:  Positive for myalgias.       Objective:   Physical Exam Constitutional:      General: She is not in acute distress.    Appearance: Normal appearance.  Cardiovascular:     Rate and Rhythm: Normal rate and regular rhythm.     Pulses: Normal pulses.     Heart sounds: Normal heart sounds.  Pulmonary:     Effort: Pulmonary effort is normal.     Breath sounds: Normal breath sounds.  Musculoskeletal:     Right lower leg: No edema.     Left lower leg: No edema.     Comments: There is a medium sized ecchymosis on the left calf, and this area is tender. No cords are felt. Homan's is negative.   Neurological:     Mental Status: She is alert.           Assessment & Plan:  She has a bruise from a bad leg cramp. She will start taking 1 or 2 tablets of 400 mg magnesium every night. Recheck as needed.  Gershon Crane, MD

## 2023-08-05 ENCOUNTER — Ambulatory Visit: Payer: Self-pay | Admitting: Family Medicine

## 2023-08-05 NOTE — Telephone Encounter (Signed)
FYI

## 2023-08-05 NOTE — Telephone Encounter (Addendum)
Copied from CRM [id number]. Topic: Clinical - Red Word Triage >> Aug 05, 2023  8:31 AM Marie Tran wrote: Red Word that prompted transfer to Nurse Triage: Patient is experiencing really bad leg cramps. Trans to Nurse   Chief Complaint: Leg cramping Symptoms: Legs aching and cramping bilaterally Frequency: Intermittent cramping at night, continual aching during day Pertinent Negatives: Patient denies chest pain, SOB, fever, redness or swelling to calves, rash, weakness, numbness, current severe pain Disposition: [] ED /[] Urgent Care (no appt availability in office) / [x] Appointment(In office/virtual)/ []  [facility]/ [] Home Care/ [] Refused Recommended Disposition /[] [facility]/ []  Follow-up with PCP Additional Notes: Pt reporting she has "had leg cramps for while," but "Saturday at 3 am, woke up with terrible cramp" on "outside muscles of left leg behind the knee," which "left bruising." Pt reporting she saw doc, doc told to "take magnesium, but had already started taking it" per recommendation of nurse family member/ Pt reporting she saw Dr. Clent Ridges on Tuesday, "made sure not blood clot since really bad bruising," Dr. Clent Ridges advised taking mag at bedtime, been taking as instructed but "last night woke up with cramps in both legs," pt confirms same spot on left leg, similar spot in right left, "mainly at night," but "foot will cramp here and there during day but had 2 major foot surgeries on right foot so used to that cramping," pt confirms aching during day then actual cramping at night, "even yesterday noticed legs just hurt, can't explain it, not felt it like that ever before," pt confirms may be kind of like soreness, "even walking or standing, just in calf part of leg, work at [employer], not strenuous or hard at all, try not to sit all day," tries to stay moving. Pt reporting that "when it wakes me up, it's off the charts, very very painful, right now painful but not as bad, hurts whenever stand  up or walk, even sitting and driving can still feel" that it's "tense." Pt reporting "2-3" out of 10 at rest. Pt denies recent work/exercise involving BLE. Pt stating "don't know if something that I'm doing, take lipitor at night time," unsure if med causes cramping to legs. Pt reporting that she "always have back pain," but experiencing "nothing out of the ordinary," pt confirms no chest pain, no redness or swelling to area. Pt reporting she "took a pickle shot last night to help, can't tell that it helped any at all." Pt denies numbness, fever, rash, weakness anywhere. Pt reporting that she takes hydrocodone daily for back pain, "try not to take it lot during day, try not to take it at work," but took it yesterday during day because leg pain was so bad and did help the pain "a little bit, could still feel it." Advised pt be examined since now in both legs, offered appt this morning with other provider in PCP office, pt declines and requesting next available with Dr. Clent Ridges, scheduled for tomorrow morning. Pt verbalized understanding to go to ED if worsening symptoms or new symptoms such as chest pain, SOB, changes to the calves.  Reason for Disposition  [1] MODERATE pain (e.g., interferes with normal activities, limping) AND [2] present > 3 days  Answer Assessment - Initial Assessment Questions 1. ONSET: "When did the pain start?"      Pt reporting she has "had leg cramps for while," but "Saturday at 3 am, woke up with terrible cramp" on "outside muscles of left leg behind the knee," which "left  bruising." Pt reporting she saw doc, doc told to "take magnesium, but had already started taking it" per recommendation of nurse family member/ Pt reporting she saw Dr. Clent Ridges on Tuesday, "made sure not blood clot since really bad bruising," Dr. Clent Ridges advised taking mag at bedtime, been taking as instructed but "last night woke up with cramps in both legs," pt confirms same spot on left leg, similar spot in right left,  "mainly at night," but "foot will cramp here and there during day but had 2 major foot surgeries on right foot so used to that cramping," pt confirms aching during day then actual cramping at night, "even yesterday noticed legs just hurt, can't explain it, not felt it like that ever before," pt confirms may be kind of like soreness, "even walking or standing, just in calf part of leg, work at [employer], not strenuous or hard at all, try not to sit all day," tries to stay moving. 3. PAIN: "How bad is the pain?"    (Scale 1-10; or mild, moderate, severe)   -  MILD (1-3): doesn't interfere with normal activities    -  MODERATE (4-7): interferes with normal activities (e.g., work or school) or awakens from sleep, limping    -  SEVERE (8-10): excruciating pain, unable to do any normal activities, unable to walk     Pt reporting that "when it wakes me up, it's off the charts, very very painful, right now painful but not as bad, hurts whenever stand up or walk, even sitting and driving can still feel" that it's "tense." Pt reporting "2-3" out of 10 at rest. 4. WORK OR EXERCISE: "Has there been any recent work or exercise that involved this part of the body?"      Pt denies recent work/exercise involving BLE. 5. CAUSE: "What do you think is causing the leg pain?"     Pt stating "don't know if something that I'm doing, take lipitor at night time," unsure if med causes cramping to legs. 6. OTHER SYMPTOMS: "Do you have any other symptoms?" (e.g., chest pain, back pain, breathing difficulty, swelling, rash, fever, numbness, weakness)     Pt reporting that she "always have back pain," but experiencing "nothing out of the ordinary," pt confirms no chest pain, no redness or swelling to area. Pt reporting she "took a pickle shot last night to help, can't tell that it helped any at all." Pt denies numbness, fever, rash, weakness anywhere. Pt reporting that she takes hydrocodone daily for back pain, "try not to take it lot  during day, try not to take it at work," but took it yesterday during day because leg pain was so bad and did help the pain "a little bit, could still feel it."  Protocols used: Leg Pain-A-AH

## 2023-08-06 ENCOUNTER — Encounter: Payer: Self-pay | Admitting: Family Medicine

## 2023-08-06 ENCOUNTER — Ambulatory Visit (INDEPENDENT_AMBULATORY_CARE_PROVIDER_SITE_OTHER): Payer: BC Managed Care – PPO | Admitting: Family Medicine

## 2023-08-06 VITALS — BP 130/80 | HR 68 | Temp 98.2°F | Wt 230.0 lb

## 2023-08-06 DIAGNOSIS — E039 Hypothyroidism, unspecified: Secondary | ICD-10-CM | POA: Diagnosis not present

## 2023-08-06 DIAGNOSIS — R252 Cramp and spasm: Secondary | ICD-10-CM | POA: Diagnosis not present

## 2023-08-06 DIAGNOSIS — E559 Vitamin D deficiency, unspecified: Secondary | ICD-10-CM | POA: Diagnosis not present

## 2023-08-06 LAB — BASIC METABOLIC PANEL
BUN: 11 mg/dL (ref 6–23)
CO2: 30 meq/L (ref 19–32)
Calcium: 9.8 mg/dL (ref 8.4–10.5)
Chloride: 103 meq/L (ref 96–112)
Creatinine, Ser: 0.78 mg/dL (ref 0.40–1.20)
GFR: 86.82 mL/min (ref >60.00)
Glucose, Bld: 85 mg/dL (ref 70–99)
Potassium: 4.2 meq/L (ref 3.5–5.1)
Sodium: 143 meq/L (ref 135–145)

## 2023-08-06 LAB — HEPATIC FUNCTION PANEL
ALT: 17 U/L (ref 0–35)
AST: 20 U/L (ref 0–37)
Albumin: 4.5 g/dL (ref 3.5–5.2)
Alkaline Phosphatase: 141 U/L — ABNORMAL HIGH (ref 39–117)
Bilirubin, Direct: 0.1 mg/dL (ref 0.0–0.3)
Total Bilirubin: 0.7 mg/dL (ref 0.2–1.2)
Total Protein: 7.4 g/dL (ref 6.0–8.3)

## 2023-08-06 LAB — T4, FREE: Free T4: 0.87 ng/dL (ref 0.60–1.60)

## 2023-08-06 LAB — CK: Total CK: 132 U/L (ref 7–177)

## 2023-08-06 LAB — T3, FREE: T3, Free: 3.4 pg/mL (ref 2.3–4.2)

## 2023-08-06 LAB — MAGNESIUM: Magnesium: 1.9 mg/dL (ref 1.5–2.5)

## 2023-08-06 LAB — VITAMIN D 25 HYDROXY (VIT D DEFICIENCY, FRACTURES): VITD: 23.91 ng/mL — ABNORMAL LOW (ref 30.00–100.00)

## 2023-08-06 LAB — TSH: TSH: 1.08 u[IU]/mL (ref 0.35–5.50)

## 2023-08-06 MED ORDER — ROPINIROLE HCL ER 4 MG PO TB24: 4.0000 mg | ORAL_TABLET | Freq: Every day | ORAL | 2 refills | Status: DC

## 2023-08-06 NOTE — Progress Notes (Signed)
   Subjective:    Patient ID: Marie Tran, female    DOB: Apr 16, 1971, 52 y.o.   MRN: [id number]  HPI Here for worsening leg cramps. These started about 3 weeks ago, and they are getting worse. They almost always occur at night, and the calves are the worst area. She has tried taking 400 mg of magnesium at bedtime with no relief. She is concerned that this is a side effect of her Lipitor. She drinks plenty of fluids.    Review of Systems  Constitutional: Negative.   Respiratory: Negative.    Cardiovascular: Negative.   Musculoskeletal:  Positive for myalgias.       Objective:   Physical Exam Constitutional:      Appearance: Normal appearance.  Cardiovascular:     Rate and Rhythm: Normal rate and regular rhythm.     Pulses: Normal pulses.     Heart sounds: Normal heart sounds.  Pulmonary:     Effort: Pulmonary effort is normal.     Breath sounds: Normal breath sounds.  Musculoskeletal:        General: No swelling.     Comments: Both calves are mildly tender, no cords felt   Neurological:     Mental Status: She is alert.           Assessment & Plan:  Leg cramps. We will stop the Lipitor, at least for now. Get labs including potassium and magnesium. She will increase the magnesium to taking 800 mg at bedtime, and we will add Ropinerole XL 4 mg at bedtime.  Gershon Crane, MD

## 2023-08-25 ENCOUNTER — Telehealth: Payer: Self-pay

## 2023-08-25 ENCOUNTER — Other Ambulatory Visit (HOSPITAL_COMMUNITY): Payer: Self-pay

## 2023-08-25 NOTE — Telephone Encounter (Signed)
 Pharmacy Patient Advocate Encounter   Received notification from CoverMyMeds that prior authorization for Phentermine  HCl 37.5MG  capsules is required/requested.   Insurance verification completed.   The patient is insured through [employer] .   Per test claim: PA required; PA submitted to above mentioned insurance via CoverMyMeds Key/confirmation #/EOC [id number] Status is pending

## 2023-08-26 ENCOUNTER — Other Ambulatory Visit (HOSPITAL_COMMUNITY): Payer: Self-pay

## 2023-08-26 NOTE — Telephone Encounter (Signed)
 Pharmacy Patient Advocate Encounter  Received notification from [employer] that Prior Authorization for Phentermien 37.5 capsules has been APPROVED from 07/26/23 to 08/24/24. Ran test claim, Copay is $0.82. This test claim was processed through [facility]- copay amounts may vary at other pharmacies due to pharmacy/plan contracts, or as the patient moves through the different stages of their insurance plan.   PA #/Case ID/Reference #: [id number]

## 2023-09-15 ENCOUNTER — Ambulatory Visit (INDEPENDENT_AMBULATORY_CARE_PROVIDER_SITE_OTHER): Payer: BC Managed Care – PPO | Admitting: Family Medicine

## 2023-09-15 ENCOUNTER — Encounter: Payer: Self-pay | Admitting: Family Medicine

## 2023-09-15 VITALS — BP 124/84 | HR 77 | Temp 98.7°F | Wt 227.0 lb

## 2023-09-15 DIAGNOSIS — F1190 Opioid use, unspecified, uncomplicated: Secondary | ICD-10-CM | POA: Diagnosis not present

## 2023-09-15 DIAGNOSIS — G8929 Other chronic pain: Secondary | ICD-10-CM

## 2023-09-15 DIAGNOSIS — M5440 Lumbago with sciatica, unspecified side: Secondary | ICD-10-CM

## 2023-09-15 MED ORDER — HYDROCODONE-ACETAMINOPHEN 10-325 MG PO TABS: 1.0000 | ORAL_TABLET | Freq: Four times a day (QID) | ORAL | 0 refills | Status: DC | PRN

## 2023-09-15 NOTE — Progress Notes (Signed)
   Subjective:    Patient ID: Marie Tran, female    DOB: 22-Aug-1970, 53 y.o.   MRN: [id number]  HPI Here for pain management. She is doing about the same. She contacted Dr. Ebony Cargo office for an appt, but they said a new referral would need to be sent.    Review of Systems  Constitutional: Negative.   Musculoskeletal:  Positive for back pain.       Objective:   Physical Exam Constitutional:      Appearance: Normal appearance.  Neurological:     Mental Status: She is alert.           Assessment & Plan:  Pain management. Indication for chronic opioid: low back pain Medication and dose: Norco 10-325 # pills per month: 120 Last UDS date: 05-31-23 Opioid Treatment Agreement signed (Y/N): 10-10-18 Opioid Treatment Agreement last reviewed with patient:  09-15-23 [facility] reviewed this encounter (include red flags): Yes Meds were refilled. We sent in a new referral to see Dr. Marissa Nestle. Gershon Crane, MD

## 2023-10-09 ENCOUNTER — Other Ambulatory Visit: Payer: Self-pay | Admitting: Family Medicine

## 2023-10-12 ENCOUNTER — Encounter: Payer: Self-pay | Admitting: Family Medicine

## 2023-10-12 ENCOUNTER — Ambulatory Visit (INDEPENDENT_AMBULATORY_CARE_PROVIDER_SITE_OTHER): Payer: BC Managed Care – PPO | Admitting: Family Medicine

## 2023-10-12 VITALS — BP 128/80 | HR 74 | Temp 98.5°F | Ht 64.0 in | Wt 226.0 lb

## 2023-10-12 DIAGNOSIS — E039 Hypothyroidism, unspecified: Secondary | ICD-10-CM

## 2023-10-12 DIAGNOSIS — Z0000 Encounter for general adult medical examination without abnormal findings: Secondary | ICD-10-CM | POA: Diagnosis not present

## 2023-10-12 LAB — HEPATIC FUNCTION PANEL
ALT: 23 U/L (ref 0–35)
AST: 23 U/L (ref 0–37)
Albumin: 4.3 g/dL (ref 3.5–5.2)
Alkaline Phosphatase: 129 U/L — ABNORMAL HIGH (ref 39–117)
Bilirubin, Direct: 0.1 mg/dL (ref 0.0–0.3)
Total Bilirubin: 0.6 mg/dL (ref 0.2–1.2)
Total Protein: 7.4 g/dL (ref 6.0–8.3)

## 2023-10-12 LAB — CBC WITH DIFFERENTIAL/PLATELET
Basophils Absolute: 0.1 10*3/uL (ref 0.0–0.1)
Basophils Relative: 1.1 % (ref 0.0–3.0)
Eosinophils Absolute: 0.1 10*3/uL (ref 0.0–0.7)
Eosinophils Relative: 2.8 % (ref 0.0–5.0)
HCT: 42.6 % (ref 36.0–46.0)
Hemoglobin: 14.5 g/dL (ref 12.0–15.0)
Lymphocytes Relative: 32.5 % (ref 12.0–46.0)
Lymphs Abs: 1.7 10*3/uL (ref 0.7–4.0)
MCHC: 33.9 g/dL (ref 30.0–36.0)
MCV: 93.5 fL (ref 78.0–100.0)
Monocytes Absolute: 0.5 10*3/uL (ref 0.1–1.0)
Monocytes Relative: 8.8 % (ref 3.0–12.0)
Neutro Abs: 2.9 10*3/uL (ref 1.4–7.7)
Neutrophils Relative %: 54.8 % (ref 43.0–77.0)
Platelets: 271 10*3/uL (ref 150.0–400.0)
RBC: 4.56 Mil/uL (ref 3.87–5.11)
RDW: 13.1 % (ref 11.5–15.5)
WBC: 5.3 10*3/uL (ref 4.0–10.5)

## 2023-10-12 LAB — BASIC METABOLIC PANEL
BUN: 13 mg/dL (ref 6–23)
CO2: 29 meq/L (ref 19–32)
Calcium: 9.7 mg/dL (ref 8.4–10.5)
Chloride: 104 meq/L (ref 96–112)
Creatinine, Ser: 0.81 mg/dL (ref 0.40–1.20)
GFR: 82.86 mL/min (ref >60.00)
Glucose, Bld: 91 mg/dL (ref 70–99)
Potassium: 4.9 meq/L (ref 3.5–5.1)
Sodium: 141 meq/L (ref 135–145)

## 2023-10-12 LAB — T4, FREE: Free T4: 1 ng/dL (ref 0.60–1.60)

## 2023-10-12 LAB — TSH: TSH: 0.4 u[IU]/mL (ref 0.35–5.50)

## 2023-10-12 LAB — LIPID PANEL
Cholesterol: 244 mg/dL — ABNORMAL HIGH (ref 0–200)
HDL: 58.6 mg/dL (ref >39.00)
LDL Cholesterol: 162 mg/dL — ABNORMAL HIGH (ref 0–99)
NonHDL: 185.84
Total CHOL/HDL Ratio: 4
Triglycerides: 120 mg/dL (ref 0.0–149.0)
VLDL: 24 mg/dL (ref 0.0–40.0)

## 2023-10-12 LAB — T3, FREE: T3, Free: 3.6 pg/mL (ref 2.3–4.2)

## 2023-10-12 LAB — MAGNESIUM: Magnesium: 2.1 mg/dL (ref 1.5–2.5)

## 2023-10-12 LAB — HEMOGLOBIN A1C: Hgb A1c MFr Bld: 5.7 % (ref 4.6–6.5)

## 2023-10-12 LAB — VITAMIN D 25 HYDROXY (VIT D DEFICIENCY, FRACTURES): VITD: 34.19 ng/mL (ref 30.00–100.00)

## 2023-10-12 MED ORDER — LEVOTHYROXINE SODIUM 100 MCG PO TABS: 100.0000 ug | ORAL_TABLET | Freq: Every day | ORAL | 3 refills | Status: AC

## 2023-10-12 MED ORDER — OMEPRAZOLE 40 MG PO CPDR: DELAYED_RELEASE_CAPSULE | ORAL | 3 refills | Status: AC

## 2023-10-12 MED ORDER — PHENTERMINE HCL 37.5 MG PO CAPS: 37.5000 mg | ORAL_CAPSULE | ORAL | 1 refills | Status: DC

## 2023-10-12 NOTE — Progress Notes (Signed)
   Subjective:    Patient ID: Marie Tran, female    DOB: 1971/05/23, 53 y.o.   MRN: [id number]  HPI Here for  a well exam. She is doing well other than her chronic back pain.    Review of Systems  Constitutional: Negative.   HENT: Negative.    Eyes: Negative.   Respiratory: Negative.    Cardiovascular: Negative.   Gastrointestinal: Negative.   Genitourinary:  Negative for decreased urine volume, difficulty urinating, dyspareunia, dysuria, enuresis, flank pain, frequency, hematuria, pelvic pain and urgency.  Musculoskeletal:  Positive for back pain.  Skin: Negative.   Neurological: Negative.  Negative for headaches.  Psychiatric/Behavioral: Negative.         Objective:   Physical Exam Constitutional:      General: She is not in acute distress.    Appearance: She is well-developed. She is obese.  HENT:     Head: Normocephalic and atraumatic.     Right Ear: External ear normal.     Left Ear: External ear normal.     Nose: Nose normal.     Mouth/Throat:     Pharynx: No oropharyngeal exudate.  Eyes:     General: No scleral icterus.    Conjunctiva/sclera: Conjunctivae normal.     Pupils: Pupils are equal, round, and reactive to light.  Neck:     Thyroid: No thyromegaly.     Vascular: No JVD.  Cardiovascular:     Rate and Rhythm: Normal rate and regular rhythm.     Pulses: Normal pulses.     Heart sounds: Normal heart sounds. No murmur heard.    No friction rub. No gallop.  Pulmonary:     Effort: Pulmonary effort is normal. No respiratory distress.     Breath sounds: Normal breath sounds. No wheezing or rales.  Chest:     Chest wall: No tenderness.  Abdominal:     General: Bowel sounds are normal. There is no distension.     Palpations: Abdomen is soft. There is no mass.     Tenderness: There is no abdominal tenderness. There is no guarding or rebound.  Musculoskeletal:        General: No tenderness. Normal range of motion.     Cervical back: Normal  range of motion and neck supple.  Lymphadenopathy:     Cervical: No cervical adenopathy.  Skin:    General: Skin is warm and dry.     Findings: No erythema or rash.  Neurological:     General: No focal deficit present.     Mental Status: She is alert and oriented to person, place, and time.     Cranial Nerves: No cranial nerve deficit.     Motor: No abnormal muscle tone.     Coordination: Coordination normal.     Deep Tendon Reflexes: Reflexes are normal and symmetric. Reflexes normal.  Psychiatric:        Mood and Affect: Mood normal.        Behavior: Behavior normal.        Thought Content: Thought content normal.        Judgment: Judgment normal.           Assessment & Plan:  Well exam. We discussed diet and exercise. Get fasting labs. Gershon Crane, MD

## 2023-11-07 ENCOUNTER — Other Ambulatory Visit: Payer: Self-pay | Admitting: Family Medicine

## 2023-11-19 ENCOUNTER — Telehealth: Payer: Self-pay | Admitting: Family Medicine

## 2023-11-19 MED ORDER — PHENTERMINE HCL 37.5 MG PO TABS: 37.5000 mg | ORAL_TABLET | Freq: Every day | ORAL | 1 refills | Status: AC

## 2023-11-19 NOTE — Telephone Encounter (Signed)
 Done

## 2023-12-13 ENCOUNTER — Other Ambulatory Visit: Payer: Self-pay | Admitting: Family Medicine

## 2023-12-13 DIAGNOSIS — Z1231 Encounter for screening mammogram for malignant neoplasm of breast: Secondary | ICD-10-CM

## 2023-12-16 ENCOUNTER — Encounter: Payer: Self-pay | Admitting: Family Medicine

## 2023-12-16 ENCOUNTER — Ambulatory Visit (INDEPENDENT_AMBULATORY_CARE_PROVIDER_SITE_OTHER): Admitting: Family Medicine

## 2023-12-16 VITALS — BP 132/80 | HR 84 | Temp 98.6°F | Wt 220.0 lb

## 2023-12-16 DIAGNOSIS — M5440 Lumbago with sciatica, unspecified side: Secondary | ICD-10-CM

## 2023-12-16 DIAGNOSIS — G8929 Other chronic pain: Secondary | ICD-10-CM | POA: Diagnosis not present

## 2023-12-16 DIAGNOSIS — F1190 Opioid use, unspecified, uncomplicated: Secondary | ICD-10-CM

## 2023-12-16 MED ORDER — HYDROCODONE-ACETAMINOPHEN 10-325 MG PO TABS
1.0000 | ORAL_TABLET | Freq: Four times a day (QID) | ORAL | 0 refills | Status: DC | PRN
Start: 2023-12-16 — End: 2024-03-22

## 2023-12-16 MED ORDER — HYDROCODONE-ACETAMINOPHEN 10-325 MG PO TABS: 1.0000 | ORAL_TABLET | Freq: Four times a day (QID) | ORAL | 0 refills | Status: DC | PRN

## 2023-12-16 NOTE — Progress Notes (Signed)
   Subjective:    Patient ID: Marie Tran, female    DOB: [birth year], 53 y.o.   MRN: [id number]  HPI Here for pain management. She is doing well.    Review of Systems  Constitutional: Negative.   Musculoskeletal:  Positive for back pain.       Objective:   Physical Exam Constitutional:      Appearance: Normal appearance. She is well-developed.  Neck:     Thyroid : No thyromegaly.     Vascular: No JVD.  Musculoskeletal:        General: Normal range of motion.  Skin:    General: Skin is warm and dry.  Neurological:     Mental Status: She is alert.     Motor: No abnormal muscle tone.     Deep Tendon Reflexes: Reflexes are normal and symmetric.           Assessment & Plan:  Pain management.  Indication for chronic opioid: low back pain Medication and dose: Noroco 10-325 # pills per month: 120 Last UDS date: 05-31-23 Opioid Treatment Agreement signed (Y/N): 10-10-18 Opioid Treatment Agreement last reviewed with patient:  12-16-23 [facility] reviewed this encounter (include red flags): Yes Meds were refilled.  Marie Diego, MD

## 2023-12-21 ENCOUNTER — Ambulatory Visit
Admission: RE | Admit: 2023-12-21 | Discharge: 2023-12-21 | Disposition: A | Discharge status: Home / Self Care | Source: Ambulatory Visit | Attending: Family Medicine | Admitting: Family Medicine

## 2023-12-21 DIAGNOSIS — Z1231 Encounter for screening mammogram for malignant neoplasm of breast: Secondary | ICD-10-CM

## 2023-12-22 ENCOUNTER — Telehealth: Payer: Self-pay

## 2023-12-22 NOTE — Telephone Encounter (Signed)
 Copied from CRM [id number]. Topic: General - Other >> Dec 22, 2023 12:10 PM Marie Tran wrote: Reason for CRM: Patient is calling in regards to a missed call from the office yesterday.Patient stated that Dr.Fry's nurse tried to give her a call and advised her to call back. Patient stated that when she called back yesterday the nurse was at lunch and never received a call back. Patient is wanting to know if she can receive a call back today if possible.

## 2023-12-24 NOTE — Telephone Encounter (Signed)
 Left a detailed message for pt to return a call at the office

## 2024-01-04 NOTE — Telephone Encounter (Signed)
 Reason for pt call was to review lab results, pt has viewed results on MyChart

## 2024-02-01 ENCOUNTER — Emergency Department (HOSPITAL_COMMUNITY)

## 2024-02-01 ENCOUNTER — Emergency Department (HOSPITAL_COMMUNITY)
Admission: EM | Admit: 2024-02-01 | Discharge: 2024-02-02 | Disposition: A | Discharge status: Home / Self Care | Attending: Emergency Medicine | Admitting: Emergency Medicine

## 2024-02-01 ENCOUNTER — Other Ambulatory Visit: Payer: Self-pay

## 2024-02-01 ENCOUNTER — Encounter (HOSPITAL_COMMUNITY): Payer: Self-pay | Admitting: *Deleted

## 2024-02-01 DIAGNOSIS — M549 Dorsalgia, unspecified: Secondary | ICD-10-CM | POA: Diagnosis not present

## 2024-02-01 DIAGNOSIS — I10 Essential (primary) hypertension: Secondary | ICD-10-CM | POA: Diagnosis not present

## 2024-02-01 DIAGNOSIS — R200 Anesthesia of skin: Secondary | ICD-10-CM | POA: Diagnosis not present

## 2024-02-01 DIAGNOSIS — R079 Chest pain, unspecified: Secondary | ICD-10-CM | POA: Diagnosis not present

## 2024-02-01 DIAGNOSIS — R072 Precordial pain: Secondary | ICD-10-CM | POA: Diagnosis not present

## 2024-02-01 DIAGNOSIS — R000 Tachycardia, unspecified: Secondary | ICD-10-CM | POA: Diagnosis not present

## 2024-02-01 DIAGNOSIS — R519 Headache, unspecified: Secondary | ICD-10-CM | POA: Diagnosis not present

## 2024-02-01 DIAGNOSIS — R748 Abnormal levels of other serum enzymes: Secondary | ICD-10-CM | POA: Insufficient documentation

## 2024-02-01 DIAGNOSIS — E039 Hypothyroidism, unspecified: Secondary | ICD-10-CM | POA: Insufficient documentation

## 2024-02-01 DIAGNOSIS — R29818 Other symptoms and signs involving the nervous system: Secondary | ICD-10-CM | POA: Diagnosis not present

## 2024-02-01 DIAGNOSIS — Z9049 Acquired absence of other specified parts of digestive tract: Secondary | ICD-10-CM | POA: Diagnosis not present

## 2024-02-01 LAB — BASIC METABOLIC PANEL WITH GFR
Anion gap: 12 (ref 5–15)
BUN: 10 mg/dL (ref 6–20)
CO2: 23 mmol/L (ref 22–32)
Calcium: 9.8 mg/dL (ref 8.9–10.3)
Chloride: 105 mmol/L (ref 98–111)
Creatinine, Ser: 0.96 mg/dL (ref 0.44–1.00)
GFR, Estimated: >60 mL/min (ref >60)
Glucose, Bld: 104 mg/dL — ABNORMAL HIGH (ref 70–99)
Potassium: 3.5 mmol/L (ref 3.5–5.1)
Sodium: 140 mmol/L (ref 135–145)

## 2024-02-01 LAB — CBC
HCT: 42.1 % (ref 36.0–46.0)
Hemoglobin: 13.8 g/dL (ref 12.0–15.0)
MCH: 30.5 pg (ref 26.0–34.0)
MCHC: 32.8 g/dL (ref 30.0–36.0)
MCV: 93.1 fL (ref 80.0–100.0)
Platelets: 330 10*3/uL (ref 150–400)
RBC: 4.52 MIL/uL (ref 3.87–5.11)
RDW: 12.8 % (ref 11.5–15.5)
WBC: 7.2 10*3/uL (ref 4.0–10.5)
nRBC: 0 % (ref 0.0–0.2)

## 2024-02-01 LAB — HEPATIC FUNCTION PANEL
ALT: 22 U/L (ref 0–44)
AST: 26 U/L (ref 15–41)
Albumin: 4 g/dL (ref 3.5–5.0)
Alkaline Phosphatase: 92 U/L (ref 38–126)
Bilirubin, Direct: <0.1 mg/dL (ref 0.0–0.2)
Total Bilirubin: 0.6 mg/dL (ref 0.0–1.2)
Total Protein: 7.6 g/dL (ref 6.5–8.1)

## 2024-02-01 LAB — LIPASE, BLOOD: Lipase: 74 U/L — ABNORMAL HIGH (ref 11–51)

## 2024-02-01 LAB — D-DIMER, QUANTITATIVE: D-Dimer, Quant: 0.46 ug{FEU}/mL (ref 0.00–0.50)

## 2024-02-01 LAB — TROPONIN I (HIGH SENSITIVITY)
Troponin I (High Sensitivity): <2 ng/L (ref <18)
Troponin I (High Sensitivity): 3 ng/L (ref <18)

## 2024-02-01 MED ORDER — IOHEXOL 350 MG/ML SOLN
75.0000 mL | Freq: Once | INTRAVENOUS | Status: AC | PRN
Start: 2024-02-01 — End: 2024-02-01
  Administered 2024-02-01: 75 mL via INTRAVENOUS

## 2024-02-01 MED ORDER — OXYCODONE-ACETAMINOPHEN 5-325 MG PO TABS
1.0000 | ORAL_TABLET | Freq: Once | ORAL | Status: AC
  Administered 2024-02-01: 1 via ORAL
  Filled 2024-02-01: qty 1

## 2024-02-01 MED ORDER — ALUM & MAG HYDROXIDE-SIMETH 200-200-20 MG/5ML PO SUSP
30.0000 mL | Freq: Once | ORAL | Status: AC
  Administered 2024-02-01: 30 mL via ORAL
  Filled 2024-02-01: qty 30

## 2024-02-01 MED ORDER — MORPHINE SULFATE (PF) 4 MG/ML IV SOLN
4.0000 mg | Freq: Once | INTRAVENOUS | Status: AC
  Administered 2024-02-01: 4 mg via INTRAVENOUS
  Filled 2024-02-01: qty 1

## 2024-02-01 MED ORDER — LIDOCAINE VISCOUS HCL 2 % MT SOLN
15.0000 mL | Freq: Once | OROMUCOSAL | Status: AC
  Administered 2024-02-01: 15 mL via ORAL
  Filled 2024-02-01: qty 15

## 2024-02-01 NOTE — ED Triage Notes (Signed)
 Pt arrived with EMS for chest discomfort while walking her dog today. EMS gave 324 mg ASA and nitroglycerin x1 with improvement in pain. VS 160/100, pulse 100, CBG 89. Pain 8/10 decreased to 5/10 after nitroglycerin.20g IVto L AC

## 2024-02-01 NOTE — ED Provider Triage Note (Signed)
 Emergency Medicine Provider Triage Evaluation Note   Barbarann Levy Skorupski , a 53 y.o. female  was evaluated in triage.  Pt complains of chest pain.  Review of Systems  Positive: Sudden, sharp lower CP to neck Negative: Fever, cough, SOB  Physical Exam  BP 135/79 (BP Location: Right Arm)   Pulse 89   Temp 98.3 F (36.8 C) (Oral)   Resp 16   LMP 09/04/2015   SpO2 100%  Gen:   Awake, no distress   Resp:  Normal effort  MSK:   Moves extremities without difficulty  Other:  Uncomfortable appearing  Medical Decision Making  Medically screening exam initiated at 7:28 PM.  Appropriate orders placed.  Turkey Nipper Amison was informed that the remainder of the evaluation will be completed by another provider, this initial triage assessment does not replace that evaluation, and the importance of remaining in the ED until their evaluation is complete.  Sudden onset of sharp, severe chest pain this afternoon, radiates to the right neck. Breathing makes it worse. No history of same. H/O thyroid  only.    Mandy Second, PA-C 02/01/24 1931

## 2024-02-01 NOTE — Discharge Instructions (Signed)
 It was a pleasure taking care of you here today.  Your workup today was reassuring.  As discussed in the room your lipase level which is a pancreas function level was mildly elevated.  I would recommend clear liquid diet over the next 24 to 36 hours, gradually increasing her diet  Return for any new or worsening symptoms such as persistent shortness of breath, chest pain going into her back, left arm or jaw, numbness, weakness, passing out, inability to tolerate liquids.

## 2024-02-01 NOTE — ED Notes (Signed)
 Pt taken to xray then to RM 34

## 2024-02-01 NOTE — ED Provider Notes (Cosign Needed)
 Signout from [place], PA-C .  Patient is a 53 year old female history of hypothyroidism, hyperlipidemia presents with complaints of central and epigastric chest pain.  Not associated with shortness of breath.  No cardiac history.  No prior PEs or DVTs.  No nausea, vomiting or diarrhea.  At time of signout, CBC and hepatic panel is unremarkable, BMP without significant abnormality, lipase is mildly elevated to 74, D-dimer is negative, first troponin is without elevation, second is pending.   Second troponin without elevation.  EKG normal sinus rhythm.  Patient is resting comfortably.  She does have mild epigastric tenderness on exam.  Discussed reassuring workup with patient as well as discharge plan.  She is understanding and agreement with plan.  Physical Exam  BP (!) 158/83   Pulse 75   Temp 98.3 F (36.8 C) (Oral)   Resp 11   LMP 09/04/2015   SpO2 100%   Physical Exam Vitals and nursing note reviewed.  Constitutional:      General: She is not in acute distress.    Appearance: She is well-developed.  HENT:     Head: Normocephalic and atraumatic.   Eyes:     Conjunctiva/sclera: Conjunctivae normal.    Cardiovascular:     Rate and Rhythm: Normal rate and regular rhythm.     Heart sounds: No murmur heard. Pulmonary:     Effort: Pulmonary effort is normal. No respiratory distress.     Breath sounds: Normal breath sounds.  Abdominal:     Palpations: Abdomen is soft.     Tenderness: There is abdominal tenderness.     Comments: Mild epigastric tenderness, soft nondistended   Musculoskeletal:        General: No swelling.     Cervical back: Neck supple.   Skin:    General: Skin is warm and dry.     Capillary Refill: Capillary refill takes less than 2 seconds.   Neurological:     Mental Status: She is alert.   Psychiatric:        Mood and Affect: Mood normal.     Procedures  Procedures  ED Course / MDM    Medical Decision Making Amount and/or Complexity of Data  Reviewed Labs: ordered. Radiology: ordered.  Risk OTC drugs. Prescription drug management.   Patient will be discharged home. The patient has been appropriately medically screened and/or stabilized in the ED. I have low suspicion for any other emergent medical condition which would require further screening, evaluation or treatment in the ED or require inpatient management. At time of discharge the patient is hemodynamically stable and in no acute distress. I have discussed work-up results and diagnosis with patient and answered all questions. Patient is agreeable with discharge plan. We discussed strict return precautions for returning to the emergency department and they verbalized understanding.          Felicie Horning, PA-C 02/02/24 0110

## 2024-02-01 NOTE — ED Provider Notes (Cosign Needed)
 [place] EMERGENCY DEPARTMENT AT [facility] Provider Note   CSN: [id number] Arrival date & time: 02/01/24  4098    Patient presents with: Chest Pain  Marie Tran is a 53 y.o. female here for evaluation of central and epigastric chest pain.  She is into the right side of her chest.  Not pleuritic.  Feels like she is swallowing air.  Has some chronic back pain.  No history of PE or DVT.  No lower extremity swelling, pain.  She has some numbness to her right lower extremity however states she has this due to prior surgery.  She did have a headache earlier today.  No shortness of breath, fever, nausea, vomiting, dizziness, syncope.  She is able to typically complete her ADLs without any difficulty.  Pain did not go into her arm.  No diaphoresis.  Still has some pain.   8/10, better with nitroglycerin down to 5/10     HPI     Prior to Admission medications   Medication Sig Start Date End Date Taking? Authorizing Provider  albuterol  (VENTOLIN  HFA) 108 (90 Base) MCG/ACT inhaler Inhale 2 puffs into the lungs every 6 (six) hours as needed for wheezing or shortness of breath. 07/07/21   Dorothe Gaster, NP  Ascorbic Acid (VITAMIN C) 1000 MG tablet Take 1,000 mg by mouth daily.    [provider]  fluticasone  (FLONASE ) 50 MCG/ACT nasal spray Place 2 sprays into both nostrils daily. 07/07/21   Dorothe Gaster, NP  HYDROcodone -acetaminophen  (NORCO) 10-325 MG tablet Take 1 tablet by mouth every 6 (six) hours as needed for moderate pain (pain score 4-6). 12/16/23   Donley Furth, MD  HYDROcodone -acetaminophen  (NORCO) 10-325 MG tablet Take 1 tablet by mouth every 6 (six) hours as needed for moderate pain (pain score 4-6). 12/16/23   Donley Furth, MD  HYDROcodone -acetaminophen  (NORCO) 10-325 MG tablet Take 1 tablet by mouth every 6 (six) hours as needed for moderate pain (pain score 4-6). 12/16/23   Donley Furth, MD  levothyroxine  (SYNTHROID ) 100 MCG tablet Take 1 tablet  (100 mcg total) by mouth daily. 10/12/23   Donley Furth, MD  mupirocin  ointment (BACTROBAN ) 2 % Apply 1 Application topically 2 (two) times daily. 05/31/23   Donley Furth, MD  omeprazole  (PRILOSEC) 40 MG capsule TAKE 1 CAPSULE (40 MG TOTAL) BY MOUTH EVERY EVENING. 10/12/23   Donley Furth, MD  phentermine  (ADIPEX-P ) 37.5 MG tablet Take 1 tablet (37.5 mg total) by mouth daily before breakfast. 11/19/23   Donley Furth, MD  SUMAtriptan  (IMITREX ) 100 MG tablet Take 1 tablet (100 mg total) by mouth as needed for migraine. May repeat in 2 hours if headache persists or recurs. 09/24/22   Donley Furth, MD  triamcinolone  cream (KENALOG ) 0.1 % APPLY TO AFFECTED AREA TWICE A DAY 07/19/23   Donley Furth, MD  valACYclovir  (VALTREX ) 1000 MG tablet Take 1 tablet (1,000 mg total) by mouth 2 (two) times daily. 09/24/22   Donley Furth, MD    Allergies: Meloxicam  and Ropinirole     Review of Systems  Constitutional: Negative.   HENT: Negative.    Respiratory: Negative.    Cardiovascular:  Positive for chest pain. Negative for palpitations and leg swelling.  Gastrointestinal:  Positive for abdominal pain. Negative for abdominal distention, anal bleeding, blood in stool, constipation, diarrhea, nausea, rectal pain and vomiting.  Genitourinary: Negative.   Musculoskeletal: Negative.   Skin: Negative.   Neurological:  Positive for  numbness (foot, chronic).  All other systems reviewed and are negative.   Updated Vital Signs BP (!) 158/83   Pulse 75   Temp 98.3 F (36.8 C) (Oral)   Resp 11   LMP 09/04/2015   SpO2 100%   Physical Exam Vitals and nursing note reviewed.  Constitutional:      General: She is not in acute distress.    Appearance: She is well-developed. She is not ill-appearing, toxic-appearing or diaphoretic.  HENT:     Head: Atraumatic.   Eyes:     Pupils: Pupils are equal, round, and reactive to light.    Cardiovascular:     Rate and Rhythm: Normal rate.     Pulses:           Radial pulses are 2+ on the right side and 2+ on the left side.     Heart sounds: Normal heart sounds. Heart sounds not distant.  Pulmonary:     Effort: Pulmonary effort is normal. No respiratory distress.     Breath sounds: Normal breath sounds.     Comments: Clear bilaterally, speaks in full sentences without difficulty Chest:     Comments: Nontender chest wall Abdominal:     General: Bowel sounds are normal. There is no distension.     Palpations: Abdomen is soft. There is no fluid wave, hepatomegaly or mass.     Tenderness: There is no abdominal tenderness. There is no guarding or rebound.   Musculoskeletal:        General: Normal range of motion.     Cervical back: Normal range of motion.   Skin:    General: Skin is warm and dry.   Neurological:     General: No focal deficit present.     Mental Status: She is alert.     Cranial Nerves: Cranial nerves 2-12 are intact.     Sensory: Sensation is intact.     Motor: Motor function is intact.     Coordination: Coordination is intact.     Gait: Gait is intact.   Psychiatric:        Mood and Affect: Mood normal.     (all labs ordered are listed, but only abnormal results are displayed) Labs Reviewed  BASIC METABOLIC PANEL WITH GFR - Abnormal; Notable for the following components:      Result Value   Glucose, Bld 104 (*)    All other components within normal limits  LIPASE, BLOOD - Abnormal; Notable for the following components:   Lipase 74 (*)    All other components within normal limits  CBC  D-DIMER, QUANTITATIVE  HEPATIC FUNCTION PANEL  TROPONIN I (HIGH SENSITIVITY)  TROPONIN I (HIGH SENSITIVITY)    EKG: EKG Interpretation Date/Time:  Tuesday February 01 2024 19:25:26 EDT Ventricular Rate:  96 PR Interval:  166 QRS Duration:  72 QT Interval:  352 QTC Calculation: 444 R Axis:   -41  Text Interpretation: Normal sinus rhythm Left axis deviation Low voltage QRS Inferior infarct , age undetermined Cannot rule out  Anterior infarct , age undetermined Abnormal ECG No previous ECGs available Confirmed by Early Glisson ([id number]) on 02/01/2024 7:27:58 PM  Radiology: CT Angio Chest/Abd/Pel for Dissection W and/or Wo Contrast Result Date: 02/01/2024 EXAM: CTA CHEST, ABDOMEN AND PELVIS WITHOUT AND WITH CONTRAST 02/01/2024 09:39:06 PM TECHNIQUE: CTA of the chest was performed without and with the administration of intravenous contrast. CTA of the abdomen and pelvis was performed without and with the administration of intravenous contrast. Multiplanar  reformatted images are provided for review. MIP images are provided for review. Automated exposure control, iterative reconstruction, and/or weight based adjustment of the mA/kV was utilized to reduce the radiation dose to as low as reasonably achievable. COMPARISON: None available. CLINICAL HISTORY: Aortic aneurysm suspected; cp, back pain, numbness. FINDINGS: VASCULATURE: AORTA: No evidence of thoracic aortic aneurysm or dissection. No evidence of abdominal aortic aneurysm or dissection. PULMONARY ARTERIES: Although not tailored for evaluation of the pulmonary arteries, there is no evidence of central pulmonary embolism. GREAT VESSELS OF AORTIC ARCH: No acute finding. No dissection. No arterial occlusion or significant stenosis. CELIAC TRUNK: No acute finding. No occlusion or significant stenosis. SUPERIOR MESENTERIC ARTERY: No acute finding. No occlusion or significant stenosis. INFERIOR MESENTERIC ARTERY: No acute finding. No occlusion or significant stenosis. RENAL ARTERIES: No acute finding. No occlusion or significant stenosis. ILIAC ARTERIES: No acute finding. No occlusion or significant stenosis. CHEST: MEDIASTINUM: No mediastinal lymphadenopathy. The heart and pericardium demonstrate no acute abnormality. LUNGS AND PLEURA: Mild mosaic attenuation/ground-glass opacity in the lungs bilaterally, favoring scattered atelectasis. Scattered small bilateral pulmonary nodules measuring  up to 3 mm (images 42, 62, and 64), likely benign. No follow-up is recommended, per Fleischner Society guidelines. No evidence of pleural effusion or pneumothorax. THORACIC BONES AND SOFT TISSUES: Cervical spine fixation hardware, incompletely visualized. No acute bone or soft tissue abnormality. ABDOMEN AND PELVIS: LIVER: The liver is unremarkable. GALLBLADDER AND BILE DUCTS: Status post cholecystectomy. No biliary ductal dilatation. SPLEEN: Calcified splenic granulomata. PANCREAS: The pancreas is unremarkable. ADRENAL GLANDS: Bilateral adrenal glands demonstrate no acute abnormality. KIDNEYS, URETERS AND BLADDER: No stones in the kidneys or ureters. No hydronephrosis. No perinephric or periureteral stranding. Urinary bladder is unremarkable. GI AND BOWEL: Stomach and duodenal sweep demonstrate no acute abnormality. There is no bowel obstruction. No abnormal bowel wall thickening or distension. Normal appendix (image 213). Sigmoid diverticulosis, without evidence of diverticulitis. REPRODUCTIVE: Reproductive organs are unremarkable. PERITONEUM AND RETROPERITONEUM: No ascites or free air. LYMPH NODES: No lymphadenopathy. ABDOMINAL BONES AND SOFT TISSUES: No acute abnormality of the bones. No acute soft tissue abnormality. LIMITATIONS/ARTIFACTS: Motion degraded images. IMPRESSION: 1. No evidence of thoracoabdominal aortic aneurysm or dissection. 2. No evidence of central pulmonary embolism. 3. No acute findings in the chest, abdomen, or pelvis. Electronically signed by: Zadie Herter MD 02/01/2024 09:46 PM EDT RP Workstation: [id number]   CT Head Wo Contrast Result Date: 02/01/2024 CLINICAL DATA:  Neuro deficit, acute, stroke suspected numbness, cp EXAM: CT HEAD WITHOUT CONTRAST TECHNIQUE: Contiguous axial images were obtained from the base of the skull through the vertex without intravenous contrast. RADIATION DOSE REDUCTION: This exam was performed according to the departmental dose-optimization program which  includes automated exposure control, adjustment of the mA and/or kV according to patient size and/or use of iterative reconstruction technique. COMPARISON:  None Available. FINDINGS: Brain: No evidence of acute infarction, hemorrhage, hydrocephalus, extra-axial collection or mass lesion/mass effect. Remote left cerebellar infarct. Vascular: No hyperdense vessel. Skull: No acute fracture. Sinuses/Orbits: Clear sinuses.  No acute orbital findings. Other: No mastoid effusions. IMPRESSION: 1. No evidence of acute intracranial abnormality. 2. Remote left cerebellar infarct. Electronically Signed   By: Stevenson Elbe M.D.   On: 02/01/2024 21:44   DG Chest 2 View Result Date: 02/01/2024 CLINICAL DATA:  Chest pain EXAM: CHEST - 2 VIEW COMPARISON:  None Available. FINDINGS: The heart size and mediastinal contours are within normal limits. Both lungs are clear. The visualized skeletal structures are unremarkable. Hardware in the cervical spine IMPRESSION: No  active cardiopulmonary disease. Electronically Signed   By: Esmeralda Hedge M.D.   On: 02/01/2024 20:22     Procedures   Medications Ordered in the ED  oxyCODONE -acetaminophen  (PERCOCET/ROXICET) 5-325 MG per tablet 1 tablet (1 tablet Oral Given 02/01/24 1939)  alum & mag hydroxide-simeth (MAALOX/MYLANTA) 200-200-20 MG/5ML suspension 30 mL (30 mLs Oral Given 02/01/24 2206)    And  lidocaine  (XYLOCAINE ) 2 % viscous mouth solution 15 mL (15 mLs Oral Given 02/01/24 2206)  iohexol (OMNIPAQUE) 350 MG/ML injection 75 mL (75 mLs Intravenous Contrast Given 02/01/24 2139)  morphine (PF) 4 MG/ML injection 4 mg (4 mg Intravenous Given 02/01/24 117)   53 year old here for evaluation of chest pain.  Located to central lower chest, epigastric region.  She has some chronic back pain.  Is able to typically complete her ADLs without difficulty.  She is afebrile, nonseptic, non-ill-appearing.  Not appear grossly fluid overloaded.  No history of PE or DVT.  Neurovascularly  intact.  Had a headache earlier today.  Will plan on labs, imaging and reassess  Labs and imaging personally viewed and interpreted:  Troponin less than 2 D-dimer 0.46 CBC without leukocytosis Metabolic panel without significant abnormality Lipase 74 Hepatic function WNL X-ray without significant abnormality EKG without ischemic changes  Patient reassessed.  Discussed CT imaging.  No acute abnormality.  Still has some pain.  Will give GI cocktail, morphine.  Care transferred to [facility], PA-C who will follow-up on remaining labs and disposition.                                  Medical Decision Making Amount and/or Complexity of Data Reviewed Independent Historian: friend External Data Reviewed: labs, radiology, ECG and notes. Labs: ordered. Decision-making details documented in ED Course. Radiology: ordered and independent interpretation performed. Decision-making details documented in ED Course. ECG/medicine tests: ordered and independent interpretation performed. Decision-making details documented in ED Course.  Risk OTC drugs. Prescription drug management. Parenteral controlled substances. Decision regarding hospitalization. Diagnosis or treatment significantly limited by social determinants of health.        Final diagnoses:  Elevated lipase  Precordial pain    ED Discharge Orders     None          Gearald Stonebraker A, PA-C 02/01/24 2315

## 2024-02-02 ENCOUNTER — Telehealth: Payer: Self-pay | Admitting: *Deleted

## 2024-02-02 NOTE — Transitions of Care (Post Inpatient/ED Visit) (Signed)
 02/02/2024  Name: Marie Tran MRN: [id number] DOB: 09/01/1970  Today's TOC FU Call Status: Today's TOC FU Call Status:: Successful TOC FU Call Completed TOC FU Call Complete Date: 02/02/24 Patient's Name and Date of Birth confirmed.  Transition Care Management Follow-up Telephone Call Date of Discharge: 02/01/24 Discharge Facility: [facility] Type of Discharge: Emergency Department Reason for ED Visit: Cardiac Conditions Cardiac Conditions Diagnosis: Chest Pain Persisting How have you been since you were released from the hospital?: Better Any questions or concerns?: No  Items Reviewed: Did you receive and understand the discharge instructions provided?: Yes Medications obtained,verified, and reconciled?: Yes (Medications Reviewed) Any new allergies since your discharge?: No Dietary orders reviewed?: Yes Type of Diet Ordered:: clear liquid diet x 2 days Do you have support at home?: Yes People in Home [RPT]: friend(s)  Medications Reviewed Today: Medications Reviewed Today     Reviewed by Adron Albino, CMA (Certified Medical Assistant) on 02/02/24 at [phone]  Med List Status: <None>   Medication Order Taking? Sig Documenting Provider Last Dose Status Informant  albuterol  (VENTOLIN  HFA) 108 (90 Base) MCG/ACT inhaler [id number] Yes Inhale 2 puffs into the lungs every 6 (six) hours as needed for wheezing or shortness of breath. Dorothe Gaster, NP  Active   Ascorbic Acid (VITAMIN C) 1000 MG tablet [id number] Yes Take 1,000 mg by mouth daily. [provider]  Active   fluticasone  (FLONASE ) 50 MCG/ACT nasal spray [id number] Yes Place 2 sprays into both nostrils daily. Dorothe Gaster, NP  Active   HYDROcodone -acetaminophen  [facility]) 10-325 MG tablet [id number] Yes Take 1 tablet by mouth every 6 (six) hours as needed for moderate pain (pain score 4-6). Donley Furth, MD  Active   HYDROcodone -acetaminophen  [facility]) 10-325 MG tablet [id number] Yes Take 1 tablet by  mouth every 6 (six) hours as needed for moderate pain (pain score 4-6). Donley Furth, MD  Active   HYDROcodone -acetaminophen  [facility]) 10-325 MG tablet [id number] Yes Take 1 tablet by mouth every 6 (six) hours as needed for moderate pain (pain score 4-6). Donley Furth, MD  Active   levothyroxine  (SYNTHROID ) 100 MCG tablet [id number] Yes Take 1 tablet (100 mcg total) by mouth daily. Donley Furth, MD  Active   mupirocin  ointment (BACTROBAN ) 2 % [id number] Yes Apply 1 Application topically 2 (two) times daily. Donley Furth, MD  Active   omeprazole  (PRILOSEC) 40 MG capsule [id number] Yes TAKE 1 CAPSULE (40 MG TOTAL) BY MOUTH EVERY EVENING. Donley Furth, MD  Active   phentermine  (ADIPEX-P ) 37.5 MG tablet [id number] Yes Take 1 tablet (37.5 mg total) by mouth daily before breakfast. Donley Furth, MD  Active   SUMAtriptan  (IMITREX ) 100 MG tablet [id number] Yes Take 1 tablet (100 mg total) by mouth as needed for migraine. May repeat in 2 hours if headache persists or recurs. Donley Furth, MD  Active   triamcinolone  cream (KENALOG ) 0.1 % [id number] Yes APPLY TO AFFECTED AREA TWICE A DAY Donley Furth, MD  Active   valACYclovir  (VALTREX ) 1000 MG tablet [id number] Yes Take 1 tablet (1,000 mg total) by mouth 2 (two) times daily. Donley Furth, MD  Active             Home Care and Equipment/Supplies: Were Home Health Services Ordered?: No Any new equipment or medical supplies ordered?: No  Functional Questionnaire: Do you need assistance with bathing/showering or dressing?: No Do you need assistance with meal preparation?: No Do you need assistance with  eating?: No Do you have difficulty maintaining continence: No Do you need assistance with getting out of bed/getting out of a chair/moving?: No Do you have difficulty managing or taking your medications?: No  Follow up appointments reviewed: PCP Follow-up appointment confirmed?: Yes Date of PCP follow-up appointment?: 02/10/24 Follow-up  Provider: [facility] Follow-up appointment confirmed?: NA Do you need transportation to your follow-up appointment?: No Do you understand care options if your condition(s) worsen?: Yes-patient verbalized understanding    SIGNATURE:  Nicolina Barrios CMA

## 2024-02-10 ENCOUNTER — Ambulatory Visit (INDEPENDENT_AMBULATORY_CARE_PROVIDER_SITE_OTHER): Admitting: Family Medicine

## 2024-02-10 ENCOUNTER — Encounter: Payer: Self-pay | Admitting: Family Medicine

## 2024-02-10 VITALS — BP 132/86 | HR 61 | Temp 98.2°F | Wt 221.0 lb

## 2024-02-10 DIAGNOSIS — K8591 Acute pancreatitis with uninfected necrosis, unspecified: Secondary | ICD-10-CM | POA: Insufficient documentation

## 2024-02-10 NOTE — Progress Notes (Signed)
   Subjective:    Patient ID: Marie Tran, female    DOB: [birth year], 53 y.o.   MRN: [id number]  HPI Here to follow up on an ED visit on 02-01-24 for epigastric pain. That day she ate fried chicken bites for lunch and shortly thereafter she developed a severe constant epigastric pain that also involved the left lower chest area. No nausea or fever or SOB. After this persisted for an hour she went to the ED. There here exam showed tenderness in the epigastrium. Labs were significant for an elevated lipase at 74. WBC was 7.2 and creatinine was 0.96. Troponins and EKG were normal. A chest CTA and a head CT were normal. CXR was normal. She was diagnosed with pancreatitis, and she was told to follow a clear liquid diet for a few days. She did this and she felt better. Then yesterday she had fired chicken bites for breakfast and the same pain returned, albeit less intense. This settled dow during the day, and today she feels fine. She has a hx of GERD which causes heartburn, and she takes Omeprazole  for this as needed. This recent pain is very different from her heartburn. Her BM's are regular.    Review of Systems  Constitutional: Negative.   Respiratory: Negative.    Cardiovascular: Negative.   Gastrointestinal:  Positive for abdominal pain. Negative for abdominal distention, blood in stool, constipation, diarrhea, nausea and vomiting.       Objective:   Physical Exam Constitutional:      Appearance: Normal appearance. She is not ill-appearing.   Cardiovascular:     Rate and Rhythm: Normal rate.     Pulses: Normal pulses.     Heart sounds: Normal heart sounds.  Pulmonary:     Effort: Pulmonary effort is normal.     Breath sounds: Normal breath sounds.  Abdominal:     General: There is no distension.     Palpations: There is no mass.     Tenderness: There is no right CVA tenderness, left CVA tenderness, guarding or rebound.     Hernia: No hernia is present.     Comments: Mildly  tender in the epigastrium    Neurological:     Mental Status: She is alert.           Assessment & Plan:  She has been having flares of pancreatitis. I advised her to avoid all fatty foods for the next 2 weeks to let this settle down. She will report back in 2 weeks.  Garnette Olmsted, MD

## 2024-02-15 ENCOUNTER — Other Ambulatory Visit: Payer: Self-pay | Admitting: Family Medicine

## 2024-02-16 NOTE — Telephone Encounter (Signed)
 The original prescription was discontinued on 08/06/2023 by Johnny Garnette LABOR, MD. Renewing this prescription may not be appropriate.    Sig: TAKE 1 TABLET BY MOUTH EVERY DAY   Does pt need to continue to d/c this>

## 2024-03-22 ENCOUNTER — Encounter: Payer: Self-pay | Admitting: Family Medicine

## 2024-03-22 ENCOUNTER — Ambulatory Visit (INDEPENDENT_AMBULATORY_CARE_PROVIDER_SITE_OTHER): Admitting: Family Medicine

## 2024-03-22 VITALS — BP 128/76 | HR 71 | Temp 98.3°F | Wt 218.0 lb

## 2024-03-22 DIAGNOSIS — M5440 Lumbago with sciatica, unspecified side: Secondary | ICD-10-CM

## 2024-03-22 DIAGNOSIS — G8929 Other chronic pain: Secondary | ICD-10-CM

## 2024-03-22 MED ORDER — HYDROCODONE-ACETAMINOPHEN 10-325 MG PO TABS: 1.0000 | ORAL_TABLET | Freq: Four times a day (QID) | ORAL | 0 refills | Status: DC | PRN

## 2024-03-22 NOTE — Progress Notes (Signed)
   Subjective:    Patient ID: Marie Tran, female    DOB: 05-10-1971, 53 y.o.   MRN: [id number]  HPI Here for pain management. She is doing well. She is trying to see Dr. Cala again, but she has to take care of paying off a balance before they will schedule her.    Review of Systems  Constitutional: Negative.   Musculoskeletal:  Positive for back pain.       Objective:   Physical Exam Constitutional:      Appearance: Normal appearance.  Neurological:     Mental Status: She is alert.           Assessment & Plan:  Pain management. Indication for chronic opioid: low back pain Medication and dose: Norco 10-325 # pills per month: 120 Last UDS date: 05-31-23 Opioid Treatment Agreement signed (Y/N): 10-10-18 Opioid Treatment Agreement last reviewed with patient:  03-22-24 [facility] reviewed this encounter (include red flags): Yes Meds were refilled.  Garnette Olmsted, MD

## 2024-06-23 ENCOUNTER — Ambulatory Visit: Admitting: Family Medicine

## 2024-06-26 ENCOUNTER — Encounter: Payer: Self-pay | Admitting: Family Medicine

## 2024-06-26 ENCOUNTER — Ambulatory Visit (INDEPENDENT_AMBULATORY_CARE_PROVIDER_SITE_OTHER): Admitting: Family Medicine

## 2024-06-26 VITALS — BP 138/86 | HR 77 | Temp 98.4°F | Wt 229.2 lb

## 2024-06-26 DIAGNOSIS — G8929 Other chronic pain: Secondary | ICD-10-CM

## 2024-06-26 DIAGNOSIS — G5601 Carpal tunnel syndrome, right upper limb: Secondary | ICD-10-CM | POA: Diagnosis not present

## 2024-06-26 DIAGNOSIS — M5440 Lumbago with sciatica, unspecified side: Secondary | ICD-10-CM | POA: Diagnosis not present

## 2024-06-26 MED ORDER — HYDROCODONE-ACETAMINOPHEN 10-325 MG PO TABS: 1.0000 | ORAL_TABLET | Freq: Four times a day (QID) | ORAL | 0 refills | Status: DC | PRN

## 2024-06-26 NOTE — Progress Notes (Signed)
   Subjective:    Patient ID: Marie Tran, female    DOB: November 15, 1970, 53 y.o.   MRN: [id number]  HPI Here for pain management. Her low back pain is about the same. She is hoping to be able to see Dr. Beuford for this after the first of the year. She also complains of right hand pain. She had a nerve conduction  study in March 2018, and this showed carpal tunnel syndrome. She saw someone then who gave her a cortisone shot but she does not remember who she saw. The shot helped for a week or so, but the pain came back.    Review of Systems  Constitutional: Negative.   Musculoskeletal:  Positive for arthralgias and back pain.       Objective:   Physical Exam Constitutional:      Appearance: She is obese.  Neurological:     Mental Status: She is alert.           Assessment & Plan:  Pain management.  Indication for chronic opioid: low back pain Medication and dose: Noroco 10-325 # pills per month: 120 Last UDS date: 06-26-24 Opioid Treatment Agreement signed (Y/N): 10-10-18 Opioid Treatment Agreement last reviewed with patient:  06-26-24 [facility] reviewed this encounter (include red flags): Yes Meds were refilled. For the carpal tunnel syndrome. We will refer her back to Hand Surgery. Garnette Olmsted, MD

## 2024-06-30 ENCOUNTER — Telehealth (INDEPENDENT_AMBULATORY_CARE_PROVIDER_SITE_OTHER): Admitting: Adult Health

## 2024-06-30 ENCOUNTER — Encounter: Payer: Self-pay | Admitting: Adult Health

## 2024-06-30 VITALS — Ht 64.0 in | Wt 229.0 lb

## 2024-06-30 DIAGNOSIS — J069 Acute upper respiratory infection, unspecified: Secondary | ICD-10-CM | POA: Diagnosis not present

## 2024-06-30 MED ORDER — AMOXICILLIN 500 MG PO CAPS: 500.0000 mg | ORAL_CAPSULE | Freq: Two times a day (BID) | ORAL | 0 refills | Status: AC

## 2024-06-30 MED ORDER — ALBUTEROL SULFATE HFA 108 (90 BASE) MCG/ACT IN AERS: 2.0000 | INHALATION_SPRAY | Freq: Four times a day (QID) | RESPIRATORY_TRACT | 0 refills | Status: AC | PRN

## 2024-06-30 NOTE — Progress Notes (Signed)
 Virtual Visit via Video Note  I connected with Marie Tran on 06/30/24 at  3:00 PM EST by a video enabled telemedicine application and verified that I am speaking with the correct person using two identifiers.  Location patient: home Location provider:work or home office Persons participating in the virtual visit: patient, provider  I discussed the limitations of evaluation and management by telemedicine and the availability of in person appointments. The patient expressed understanding and agreed to proceed.   HPI:  Assessment and Plan    Acute upper respiratory infection Symptoms suggest acute upper respiratory infection. Differential includes sinusitis and strep throat. No fever, chills, wheezing, or shortness of breath. Prefers amoxicillin . - Prescribed amoxicillin . - Refilled inhaler. - Advised over-the-counter medications like Mucinex or Delsym.        ROS: See pertinent positives and negatives per HPI.  Past Medical History:  Diagnosis Date   Anemia    Breast lump    right breast, UOQ, intramammary lymph node, also left breast cyst    Bronchitis    Complication of anesthesia    hard to wake up after lap choli   Fatty liver disease, nonalcoholic    GERD (gastroesophageal reflux disease)    Hoarseness    Hyperlipidemia    Hypothyroidism    Migraines    Neck pain, chronic    sees Dr. Oneil Priestly   Obesity    Overactive bladder    PONV (postoperative nausea and vomiting)    had to have scope patch last surgery   Snores    Wears dentures    top    Past Surgical History:  Procedure Laterality Date   CERVICAL DISC SURGERY  10/15/2012   at C5-C6 per Dr. Priestly    CHOLECYSTECTOMY  08/18/2007   COLONOSCOPY  11/24/2022   per Dr. Leigh, adenimatous polyps, repeat in 5 yrs   FOOT SURGERY Right    x 2   ROTATOR CUFF REPAIR W/ DISTAL CLAVICLE EXCISION Right 09/05/2013   per Dr. Dozier    TUBAL LIGATION      Family History  Problem Relation Age  of Onset   Ulcerative colitis Mother    Cancer Mother        breast   Colon polyps Mother    Bladder Cancer Father    Leukemia Father    Colon polyps Father    Colon polyps Sister    Colon polyps Brother    Cancer Brother        brain   Brain cancer Brother    Colon cancer Maternal Aunt        found at age   Hypertension Other    Cancer Other        ovarian,uterine,breast   Stroke Other        female 1st degree relative <50   Crohn's disease Neg Hx    Esophageal cancer Neg Hx    Rectal cancer Neg Hx    Stomach cancer Neg Hx        Current Outpatient Medications:    amoxicillin  (AMOXIL ) 500 MG capsule, Take 1 capsule (500 mg total) by mouth 2 (two) times daily for 10 days., Disp: 20 capsule, Rfl: 0   albuterol  (VENTOLIN  HFA) 108 (90 Base) MCG/ACT inhaler, Inhale 2 puffs into the lungs every 6 (six) hours as needed for wheezing or shortness of breath., Disp: 8 g, Rfl: 0   Ascorbic Acid (VITAMIN C) 1000 MG tablet, Take 1,000 mg by mouth daily., Disp: , Rfl:  atorvastatin  (LIPITOR) 20 MG tablet, TAKE 1 TABLET BY MOUTH EVERY DAY, Disp: 90 tablet, Rfl: 3   fluticasone  (FLONASE ) 50 MCG/ACT nasal spray, Place 2 sprays into both nostrils daily., Disp: 16 g, Rfl: 0   HYDROcodone -acetaminophen  (NORCO) 10-325 MG tablet, Take 1 tablet by mouth every 6 (six) hours as needed for moderate pain (pain score 4-6)., Disp: 120 tablet, Rfl: 0   HYDROcodone -acetaminophen  (NORCO) 10-325 MG tablet, Take 1 tablet by mouth every 6 (six) hours as needed for moderate pain (pain score 4-6)., Disp: 120 tablet, Rfl: 0   HYDROcodone -acetaminophen  (NORCO) 10-325 MG tablet, Take 1 tablet by mouth every 6 (six) hours as needed for moderate pain (pain score 4-6)., Disp: 120 tablet, Rfl: 0   levothyroxine  (SYNTHROID ) 100 MCG tablet, Take 1 tablet (100 mcg total) by mouth daily., Disp: 90 tablet, Rfl: 3   mupirocin  ointment (BACTROBAN ) 2 %, Apply 1 Application topically 2 (two) times daily., Disp: 22 g, Rfl: 2    omeprazole  (PRILOSEC) 40 MG capsule, TAKE 1 CAPSULE (40 MG TOTAL) BY MOUTH EVERY EVENING., Disp: 90 capsule, Rfl: 3   phentermine  (ADIPEX-P ) 37.5 MG tablet, Take 1 tablet (37.5 mg total) by mouth daily before breakfast., Disp: 90 tablet, Rfl: 1   SUMAtriptan  (IMITREX ) 100 MG tablet, Take 1 tablet (100 mg total) by mouth as needed for migraine. May repeat in 2 hours if headache persists or recurs., Disp: 10 tablet, Rfl: 5   triamcinolone  cream (KENALOG ) 0.1 %, APPLY TO AFFECTED AREA TWICE A DAY, Disp: 30 g, Rfl: 2   valACYclovir  (VALTREX ) 1000 MG tablet, Take 1 tablet (1,000 mg total) by mouth 2 (two) times daily., Disp: 10 tablet, Rfl: 5  EXAM:  VITALS per patient if applicable:  GENERAL: alert, oriented, appears well and in no acute distress  HEENT: atraumatic, conjunttiva clear, no obvious abnormalities on inspection of external nose and ears  NECK: normal movements of the head and neck  LUNGS: on inspection no signs of respiratory distress, breathing rate appears normal, no obvious gross SOB, gasping or wheezing  CV: no obvious cyanosis  MS: moves all visible extremities without noticeable abnormality  PSYCH/NEURO: pleasant and cooperative, no obvious depression or anxiety, speech and thought processing grossly intact  ASSESSMENT AND PLAN:  Discussed the following assessment and plan:  Upper respiratory tract infection, unspecified type - Plan: amoxicillin  (AMOXIL ) 500 MG capsule, albuterol  (VENTOLIN  HFA) 108 (90 Base) MCG/ACT inhaler  Assessment and Plan    Acute upper respiratory infection Symptoms suggest acute upper respiratory infection. Differential includes sinusitis and strep throat. No fever, chills, wheezing, or shortness of breath. Prefers amoxicillin . - Prescribed amoxicillin . - Refilled inhaler. - Advised over-the-counter medications like Mucinex or Delsym.     Follow up if not improving in the next 2-3 days     I discussed the assessment and treatment plan  with the patient. The patient was provided an opportunity to ask questions and all were answered. The patient agreed with the plan and demonstrated an understanding of the instructions.   The patient was advised to call back or seek an in-person evaluation if the symptoms worsen or if the condition fails to improve as anticipated.   Marie Bickham, NP

## 2024-07-16 NOTE — Progress Notes (Unsigned)
 Marie Tran - 53 y.o. female MRN [id number]  Date of birth: 08-16-1971  Office Visit Note: Visit Date: 07/17/2024 PCP: Johnny Garnette LABOR, MD Referred by: Johnny Garnette LABOR, MD  Subjective: No chief complaint on file.  HPI: Marie Tran is a pleasant 53 y.o. female who presents today for evaluation of ongoing right wrist radial sided pain with associated numbness and tingling.  She has a history of a prior EMG in 2018 which did show bilateral carpal tunnel syndrome.  She has been doing bracing of the right side particularly for nocturnal symptoms without lasting relief.  States that the numbness and tingling is becoming more prevalent throughout the course of the day and is limiting her activities.  As far as the radial sided wrist pain, this radiates up and down the thumb and into the forearm, related mostly to heavy grip and pinch activity.  Pertinent ROS were reviewed with the patient and found to be negative unless otherwise specified above in HPI.   Visit Reason: right wrist Duration of symptoms: years Hand dominance: right Occupation: Psychologist, Occupational, Nurse, Adult general Diabetic: No Smoking: No Heart/Lung History: none Blood Thinners:  none  Prior Testing/EMG: EMG 2018 Injections (Date): none Treatments: brace Prior Surgery:none    Assessment & Plan: Visit Diagnoses:  1. Carpal tunnel syndrome, bilateral   2. De Quervain's tenosynovitis, right     Plan: Extensive discussion was had with the patient today about her ongoing right sided carpal tunnel syndrome that is refractory to conservative care.  Patient has both clinical and electrodiagnostic evidence to confirm this diagnosis.  At this juncture, she is indicated for right open versus endoscopic carpal tunnel release.  Risks and benefits of both operations were discussed in detail today.    As far as the radial sided wrist pain, extensive discussion was had with the patient today regarding her right wrist radial  sided pain.  Patient has signs and symptoms consistent with de Quervain's tenosynovitis.  We discussed the underlying etiology and pathophysiology of this condition as well as treatment modalities ranging from conservative to surgical.  From a conservative standpoint, we discussed activity modification, anti-inflammatory medication both topical and oral, bracing, therapy and cortisone injections.  From a surgical standpoint, I explained to patient that we can perform right wrist first extensor compartment release with possible tenosynovectomy  Understanding all risks and benefits, given her ongoing symptoms, patient would like to have surgery done in the form of right open carpal tunnel release and right wrist first extensor compartment release with possible tenosynovectomy under IV sedation.  Risks include but not limited to infection, bleeding, scarring, stiffness, nerve injury or vascular, tendon injury, risk of recurrence and need for subsequent operation were all discussed in detail.  Patient consented understanding the above.  Will move forward surgical scheduling.   Follow-up: No follow-ups on file.   Meds & Orders: No orders of the defined types were placed in this encounter.  No orders of the defined types were placed in this encounter.    Procedures: No procedures performed      Clinical History: No specialty comments available.  She reports that she quit smoking about 35 years ago. Her smoking use included cigarettes. She has never used smokeless tobacco.  Recent Labs    10/12/23 0933  HGBA1C 5.7    Objective:   Vital Signs: LMP 09/04/2015   Physical Exam  Gen: Well-appearing, in no acute distress; non-toxic CV: Regular Rate. Well-perfused. Warm.  Resp: Breathing unlabored on room  air; no wheezing. Psych: Fluid speech in conversation; appropriate affect; normal thought process  Ortho Exam General: Patient is well appearing and in no distress.   Skin and Muscle: No  skin changes are apparent to upper extremities.  Muscle bulk and contour normal, no signs of atrophy.     Range of Motion and Palpation Tests: Mobility is full about the elbows with flexion and extension.  Forearm supination and pronation are 85/85 bilaterally.  Wrist flexion/extension is 75/65 bilateral, Digital flexion and extension are full.    No cords or nodules are palpated.  No triggering is observed.   Finklestein test is positive right side, significant pain with associated swelling and tenderness.    Neurologic, Vascular, Motor: Sensation is diminished to light touch in the bilateral median nerve distribution Positive Tinel's right carpal tunnel, positive Phalen's right, positive Durkan's compression bilateral Fingers pink and well perfused.  Capillary refill is brisk.      Imaging: No results found.  Past Medical/Family/Surgical/Social History: Medications & Allergies reviewed per EMR, new medications updated. 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 Oneil Priestly   Obesity    Overactive bladder    PONV (postoperative nausea and vomiting)    had to have scope patch last surgery   Snores    Wears dentures    top   Family History  Problem Relation Age of Onset   Ulcerative colitis Mother    Cancer Mother        breast   Colon polyps Mother    Bladder Cancer Father    Leukemia Father    Colon polyps Father    Colon polyps Sister    Colon polyps Brother    Cancer Brother        brain   Brain cancer Brother    Colon cancer Maternal Aunt        found at age   Hypertension Other    Cancer Other        ovarian,uterine,breast   Stroke Other        female 1st degree relative <50   Crohn's disease Neg Hx    Esophageal cancer Neg Hx    Rectal cancer Neg Hx    Stomach cancer Neg Hx    Past Surgical History:  Procedure  Laterality Date   CERVICAL DISC SURGERY  10/15/2012   at C5-C6 per Dr. Beuford    CHOLECYSTECTOMY  08/18/2007   COLONOSCOPY  11/24/2022   per Dr. Leigh, adenimatous polyps, repeat in 5 yrs   FOOT SURGERY Right    x 2   ROTATOR CUFF REPAIR W/ DISTAL CLAVICLE EXCISION Right 09/05/2013   per Dr. Dozier    TUBAL LIGATION     Social History   Occupational History   Not on file  Tobacco Use   Smoking status: Former    Current packs/day: 0.00    Types: Cigarettes    Quit date: 09/08/1988    Years since quitting: 35.8   Smokeless tobacco: Never  Vaping Use   Vaping status: Never Used  Substance and Sexual Activity   Alcohol use: No    Alcohol/week: 0.0 standard drinks of alcohol   Drug use: No   Sexual activity: Not on file    Desira Alessandrini Afton Alderton, M.D.  [facility]

## 2024-07-17 ENCOUNTER — Ambulatory Visit: Admitting: Orthopedic Surgery

## 2024-07-17 DIAGNOSIS — M654 Radial styloid tenosynovitis [de Quervain]: Secondary | ICD-10-CM

## 2024-07-17 DIAGNOSIS — G5603 Carpal tunnel syndrome, bilateral upper limbs: Secondary | ICD-10-CM

## 2024-08-03 ENCOUNTER — Other Ambulatory Visit: Payer: Self-pay

## 2024-08-03 DIAGNOSIS — G5603 Carpal tunnel syndrome, bilateral upper limbs: Secondary | ICD-10-CM

## 2024-08-03 DIAGNOSIS — M654 Radial styloid tenosynovitis [de Quervain]: Secondary | ICD-10-CM

## 2024-08-31 ENCOUNTER — Other Ambulatory Visit: Payer: Self-pay | Admitting: Orthopedic Surgery

## 2024-08-31 DIAGNOSIS — G5601 Carpal tunnel syndrome, right upper limb: Secondary | ICD-10-CM | POA: Diagnosis not present

## 2024-08-31 DIAGNOSIS — M654 Radial styloid tenosynovitis [de Quervain]: Secondary | ICD-10-CM | POA: Diagnosis not present

## 2024-09-13 NOTE — Therapy (Signed)
 " OUTPATIENT OCCUPATIONAL THERAPY ORTHO EVALUATION AND DISCHARGE NOTE  Patient Name: Marie Tran MRN: [id number] DOB:01-06-1971, 54 y.o., female Today's Date: 09/14/2024  REFERRING PROVIDER: Arlinda Buster, MD   END OF SESSION:   Past Medical History:  Diagnosis Date   Anemia    Breast lump    right breast, UOQ, intramammary lymph node, also left breast cyst    Bronchitis    Complication of anesthesia    hard to wake up after lap choli   Fatty liver disease, nonalcoholic    GERD (gastroesophageal reflux disease)    Hoarseness    Hyperlipidemia    Hypothyroidism    Migraines    Neck pain, chronic    sees Dr. Oneil Priestly   Obesity    Overactive bladder    PONV (postoperative nausea and vomiting)    had to have scope patch last surgery   Snores    Wears dentures    top   Past Surgical History:  Procedure Laterality Date   CERVICAL DISC SURGERY  10/15/2012   at C5[facility]C6 per Dr. Priestly    CHOLECYSTECTOMY  08/18/2007   COLONOSCOPY  11/24/2022   per Dr. Leigh, adenimatous polyps, repeat in 5 yrs   FOOT SURGERY Right    x 2   ROTATOR CUFF REPAIR W/ DISTAL CLAVICLE EXCISION Right 09/05/2013   per Dr. 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   OVERWEIGHT 07/24/2009   OVERACTIVE BLADDER 05/31/2009   PERIMENOPAUSAL SYNDROME 05/31/2009   LOW BACK PAIN, CHRONIC 04/25/2008    Hypothyroidism 10/06/2007   HYPERLIPIDEMIA 10/06/2007     ONSET DATE:  DOS 08/31/24  REFERRING DIAG:  G56.03 (ICD-10-CM) - Carpal tunnel syndrome, bilateral  M65.4 (ICD-10-CM) - De Quervain's tenosynovitis, right    THERAPY DIAG:     Localized edema  Muscle weakness (generalized)  Other lack of coordination  Pain in right wrist  Pain in right hand  Stiffness of right hand, not elsewhere classified  Stiffness of right wrist, not elsewhere classified  Rationale for Evaluation and Treatment: Rehabilitation  SUBJECTIVE:   SUBJECTIVE STATEMENT: The patient states hx of paresthesia and pain in their hand and subsequent surgical release of the carpal tunnel.  She also had de Quervain's tenosynovitis and release of the first dorsal compartment.  Now the patient states having some lingering paresthesia, stiffness, pain, decreased ability to move her wrist and arm and perform I/ADLs.   The doctor ordered a custom orthosis to protect her wrist.    PERTINENT HISTORY: The patient is now approx 2 weeks s/p Rt hand CTR, DQR  PRECAUTIONS: None relative to this evaluation and episode of care.   RED FLAGS: None   WEIGHT BEARING RESTRICTIONS: Yes: caution with weightbearing for the next 4-6 weeks, recommended less than 5lbs for next 2 weeks with affected hand  PAIN:  Are you having pain? Yes: NPRS scale: mild now at rest  Pain location:  sx area Pain description: aching and sore Aggravating factors: gripping/squeezing Relieving factors: rest  FALLS: Has patient fallen in last 6 months? No, not a fall risk  PLOF: Independent with I/ADLs  PATIENT GOALS: To improve motion, function with affected surgical hand  NEXT MD VISIT: PRN    OBJECTIVE MEASURES:   ADLs: Overall ADLs: States decreased ability to grab, hold household objects, pain and difficulty to open containers, perform FMS tasks (manipulate fasteners on clothing).     UPPER EXTREMITY ROM:     A/ROM Right eval  Left eval  Wrist flexion 27 72  Wrist extension 51 70  (Blank rows = not tested)                    Hand A/ROM Right eval  Full Fist Ability (or Gap to Distal Palmar Crease) Loose full fist  Thumb Opposition  (Kapandji Scale)  6/10  (Blank rows = not tested)   HAND STRENGTH & FUNCTION: Eval: Observed weakness in affected hand/arm, grossly 3-/5 MMT, but specific gripping and resistance training contraindicated today. Also at least mild observed coordination impairments with affected hand/arm due to stiffness and soreness. These deficits are expected to improve with HEP and recommendations.    COORDINATION: Eval: Mild observed coordination impairments with surgical hand, as seen by pain,stiffness, etc. Expected to improve with HEP and recommendations.   SENSATION: Eval:  Light touch mildly diminished especially through sx area. Expected to improve with HEP and recommendations.   EDEMA:   Eval:  Mildly swollen in surgical hand today.  Expected to improve with HEP and recommendations.   COGNITION: Eval: Overall cognitive status: WFL for evaluation today   OBSERVATIONS:   Eval: Surgical site is clean and no overt signs of infection, no drainage, signs of dehiscence, etc.  Tenderness and swelling is within normal limits for post-op timeframe.     TODAY'S TREATMENT:  Post-evaluation treatment:    Custom orthotic fabrication was indicated due to pt's Rt and need for safe, functional positioning. OT fabricated custom forearm based thumb spica orthosis for pt today to protect the healing surgical sites. It fit well with no areas of pressure, pt states a comfortable fit. Pt was educated on the wearing schedule (on at all times except for hygiene and exercises, 4-6 x day, to be worn only 1-2 weeks as needed), to avoid exposing it to sources of heat, to wipe clean as needed (do not wash, use harsh detergents), to call or come in ASAP if it is causing any irritation or is not achieving  desired function. It will be checked/adjusted in upcoming sessions, as needed. Pt states understanding all directions.    The patient was given safety information for managing post-op wound, including not to soak wound, to keep clean and dry, to start with gentle light touch for desensitization and eventually more aggressive scar mobilizations approx 5-7 days after stitches are removed and if the wound is closed. The patient should monitor for signs of infection.  The patient was supplied with compressive gauze to help with swelling as needed.  The patient should contact the surgeon with any concerns immediately.   The patient should also avoid any strong gripping, push, pull, weight bearing or repetitive motion for the next 4-6 weeks.  The patient should not be doing painful activities.  The patient should not rest on their palm, keep the wrist bent for long time periods, or sleep on their hand. After a month, the patient can progressively return to  all light, normal activities. Sports and heavy weight lifting should be withheld for a total of 3 months.   The patient was also educated (explanation and demonstration) on the following home exercise program including tolerable range of motion, gentle passive range of motion, scar care, progressive desensitization, prevention of soft tissue contractures, etc. The patient states understanding all directions and feels comfortable with doing this at home, self-management, and following up with the surgeon as needed/scheduled.    Exercises - Standing Radial Nerve Glide  - 4-6 x daily - 1 sets - 10-15 reps - Median Nerve Flossing  - 3 x daily - 5 reps - Turn Palm Facing Up & Down  - 4-6 x daily - 10-15 reps - Forearm Supination Stretch  - 3-4 x daily - 3-5 reps - 15 sec hold - Forearm Pronation Stretch  - 3-4 x daily - 3-5 reps - 15 sec hold - Bend and Pull Back Wrist SLOWLY  - 4 x daily - 10-15 reps - Windshield Wipers   - 4 x daily - 10-15 reps - Wrist  Flexion Stretch  - 4 x daily - 3-5 reps - 15 sec hold - Wrist Prayer Stretch  - 4 x daily - 3-5 reps - 15 sec hold - Tendon Glides  - 4-6 x daily - 3-5 reps - 2-3 seconds hold - Thumb Opposition  - 4-6 x daily - 10 reps - Stretch thumb toward base of small finger (put hand in LAP)  - 2-3 x daily - 3-5 reps - 15 sec hold - Full finger stretches   - 4 x daily - 3 reps - 15 second hold - PUSH KNUCKLES DOWN  - 4 x daily - 3-5 reps - 15 seconds hold Patient Education - Scar Massage   PATIENT EDUCATION: Education details: See tx section above for details  Person educated: Patient Education method: Verbal Instruction, Teach back, Handouts  Education comprehension: States and demonstrates understanding   HOME EXERCISE PROGRAM: Access Code: [email or web address] URL: [email or web address] Date: 09/14/2024 Prepared by: Melvenia Ada   GOALS: Goals reviewed with patient? Yes   SHORT TERM GOALS: (STG required if POC>30 days) Target Date: 09/14/24  1.  Pt will demo/state understanding of initial HEP and therapist recommendations to improve pain levels, improve motions and ability and eventually return to normal activities.   Goal status: MET    ASSESSMENT:  CLINICAL IMPRESSION: Patient is a 54 y.o. female who was seen today for occupational therapy evaluation for swelling, pain, weakness and decreased functional ability following Rt deQuervain's release and carpal tunnel release procedures. The patient is appropriate for OT rehab services and benefited from treatment today. The patient got copious education/treatment today for self-care, wound management, exercises and how to transition to normal activities in the next 4-6 weeks. The patient agrees that they can manage these recommendations independently, and should not need to return for follow up visits. The patient should follow up with the surgeon with any concerns, and could possibly return to therapy, if needed, with a new order.   The patient will discharge therapy treatment after this visit.     PERFORMANCE DEFICITS: in functional skills including ADLs, IADLs, coordination, dexterity, sensation, edema, ROM, strength, pain, fascial restrictions, flexibility, Fine motor control, body mechanics, endurance, decreased knowledge of precautions, wound, and UE functional use, cognitive skills including problem solving and safety awareness, and psychosocial skills including coping strategies, environmental adaptation, and habits.   IMPAIRMENTS: are limiting patient from ADLs, IADLs, rest and sleep, leisure, and  social participation.   COMORBIDITIES: may have co-morbidities  that affects occupational performance. Patient will benefit from skilled OT to address above impairments and improve overall function.  MODIFICATION OR ASSISTANCE TO COMPLETE EVALUATION: No modification of tasks or assist necessary to complete an evaluation.  OT OCCUPATIONAL PROFILE AND HISTORY: Problem focused assessment: Including review of records relating to presenting problem.  CLINICAL DECISION MAKING: LOW - limited treatment options, no task modification necessary  REHAB POTENTIAL: Excellent  EVALUATION COMPLEXITY: Low      PLAN:  OT FREQUENCY: one time visit  OT DURATION: 1 sessions  PLANNED INTERVENTIONS: self care/ADL training, therapeutic exercise, therapeutic activity, neuromuscular re-education, manual therapy, scar mobilization, passive range of motion, splinting, ultrasound, fluidotherapy, compression bandaging, moist heat, cryotherapy, contrast bath, patient/family education, energy conservation, coping strategies training, and Re-evaluation  RECOMMENDED OTHER SERVICES: none now   CONSULTED AND AGREED WITH PLAN OF CARE: Patient  PLAN FOR NEXT SESSION:   N/A    Melvenia Ada, OTR/L, CHT 09/14/2024, 1:10 PM  "

## 2024-09-14 ENCOUNTER — Encounter: Payer: Self-pay | Admitting: Family Medicine

## 2024-09-14 ENCOUNTER — Encounter: Payer: Self-pay | Admitting: Rehabilitative and Restorative Service Providers"

## 2024-09-14 ENCOUNTER — Ambulatory Visit: Admitting: Rehabilitative and Restorative Service Providers"

## 2024-09-14 ENCOUNTER — Ambulatory Visit: Admitting: Orthopedic Surgery

## 2024-09-14 ENCOUNTER — Ambulatory Visit (INDEPENDENT_AMBULATORY_CARE_PROVIDER_SITE_OTHER): Admitting: Family Medicine

## 2024-09-14 VITALS — BP 138/90 | HR 68 | Temp 98.4°F | Wt 230.0 lb

## 2024-09-14 DIAGNOSIS — R600 Localized edema: Secondary | ICD-10-CM

## 2024-09-14 DIAGNOSIS — Z9889 Other specified postprocedural states: Secondary | ICD-10-CM

## 2024-09-14 DIAGNOSIS — R278 Other lack of coordination: Secondary | ICD-10-CM

## 2024-09-14 DIAGNOSIS — M6281 Muscle weakness (generalized): Secondary | ICD-10-CM

## 2024-09-14 DIAGNOSIS — G8929 Other chronic pain: Secondary | ICD-10-CM

## 2024-09-14 DIAGNOSIS — M79641 Pain in right hand: Secondary | ICD-10-CM | POA: Diagnosis not present

## 2024-09-14 DIAGNOSIS — M5440 Lumbago with sciatica, unspecified side: Secondary | ICD-10-CM | POA: Diagnosis not present

## 2024-09-14 DIAGNOSIS — M25631 Stiffness of right wrist, not elsewhere classified: Secondary | ICD-10-CM | POA: Diagnosis not present

## 2024-09-14 DIAGNOSIS — M654 Radial styloid tenosynovitis [de Quervain]: Secondary | ICD-10-CM

## 2024-09-14 DIAGNOSIS — M25531 Pain in right wrist: Secondary | ICD-10-CM

## 2024-09-14 DIAGNOSIS — M25641 Stiffness of right hand, not elsewhere classified: Secondary | ICD-10-CM | POA: Diagnosis not present

## 2024-09-14 DIAGNOSIS — G5603 Carpal tunnel syndrome, bilateral upper limbs: Secondary | ICD-10-CM

## 2024-09-14 MED ORDER — HYDROCODONE-ACETAMINOPHEN 10-325 MG PO TABS: 1.0000 | ORAL_TABLET | Freq: Four times a day (QID) | ORAL | 0 refills | Status: AC | PRN

## 2024-09-14 NOTE — Progress Notes (Signed)
" ° °  Subjective:    Patient ID: Marie Tran, female    DOB: 1971-03-25, 55 y.o.   MRN: [id number]  HPI Here for pain management. Her back pain is about the same. She is hoping to see Dr. Cala again sometime next month. She recently had carpal tunnel surgery on the right wrist per Dr. Arlinda.    Review of Systems  Constitutional: Negative.   Musculoskeletal:  Positive for back pain.       Objective:   Physical Exam Constitutional:      Appearance: Normal appearance.  Neurological:     Mental Status: She is alert.           Assessment & Plan:  Pain management. Indication for chronic opioid: low back pain Medication and dose: Norco 10-325 # pills per month: 120 Last UDS date: 09-14-24 Opioid Treatment Agreement signed (Y/N): 10-10-18 Opioid Treatment Agreement last reviewed with patient:  09-14-24 [facility] reviewed this encounter (include red flags):  yes Meds were refilled.  Garnette Olmsted, MD  "

## 2024-09-14 NOTE — Patient Instructions (Signed)
 Nate[facility] CTR and deQuervain's Release Recommendations   Do not to soak your wound for at least 1 week, or until it looks well healed. Wash hand or shower quickly, then dab dry gently. You can put a Vaseline or plain lotion on your closed wound to help the scar heal smoothly. (Don't leave it looking dry and crusty.)   Avoid any strong gripping, push, pull, weight bearing or repetitive motion for the next 4-6 weeks.  (If it hurts very badly- don't do it!) After a month, you can progressively return to all normal, light activities. Sports and heavy weightlifting should be withheld for about 3 months.   Try not to lean on your palm, sleep on your hand or position your wrist in a bent position for a long time. These things put pressure on your healing nerve. Your sensation (or tingling/numbness) should slowly improve as your nerve heals, but it can be normal to take 3-6 months to fully heal.   There are 2 reasons to touch your scar:  #1- Lightly touching the surface of your skin for your nerves and sensation  #2- At 3 weeks after surgery, you can start shifting the top layer of your skin to move your scar.   Spend 3-4 minutes, 4x day either lightly touching or moving your skin/scar.   It's normal to have some buzzing or burning pain after a nerve surgery. If you have any tingling or itching/burning around your scar, just lightly rub/brush it with a cloth for 2-3 mins to calm your nerves. Do it progressively and rub and touch more aggressively as the weeks go on. Keep it pretty light for the first 3-4 weeks.   Begin with gentle motion exercises about 4 times a day, non-painfully, feeling light to medium tension.  (See exercise sheet)  Wait 4-5 weeks after surgery to use squeeze balls or hand grippers  Wait 7-8 weeks after surgery to start lightly strengthening your arm with 5-10lbs weights  Wait 10-12 weeks to begin heavy strengthening in a gym setting   In general, keep your hand moving, and  don't let it heal stiffly.  If you're sore- slow down!   If you're stiff- speed up!    If you are having any new problems, lingering stiffness, pain, signs of infection (redness, swelling, oozing puss, tenderness), contact your surgeon immediately.

## 2024-09-14 NOTE — Progress Notes (Signed)
" ° °  Richerd Hang Ammon - 54 y.o. female MRN [id number]  Date of birth: 25-Sep-1970  Office Visit Note: Visit Date: 09/14/2024 PCP: Johnny Garnette LABOR, MD Referred by: Johnny Garnette LABOR, MD  Subjective:  HPI: Marie Tran is a 54 y.o. female who presents today for follow up 2 weeks status post right wrist de Quervain's release with cyst exicision and open carpal tunnel release. She is doing well postoperatively.   Pertinent ROS were reviewed with the patient and found to be negative unless otherwise specified above in HPI.   Assessment & Plan: Visit Diagnoses:  1. Carpal tunnel syndrome, bilateral   2. De Quervain's tenosynovitis, right   3. S/P carpal tunnel release   4. S/P excision of ganglion cyst     Plan: Sutures removed in the office today. She will see occupational therapy same day for early ROM and splint fabrication. Reviewed pathology from cyst excision; ganglion cyst. Work note given today; 5lb lifting restriction until follow up. She will return to see me in 2 weeks for clinical recheck and update work status.  Follow-up: No follow-ups on file.   Meds & Orders: No orders of the defined types were placed in this encounter.  No orders of the defined types were placed in this encounter.    Procedures: No procedures performed       Objective:   Vital Signs: LMP 09/04/2015   Ortho Exam Right  wrist: - Well-healed incision at the glabrous/nonglabrous juncture over the radial wrist - Thumb opposition to the small finger DPC - Thumb circumduction without significant pain or crepitus - Hand is warm well-perfused, sensation intact in all distributions including DRSN   Right hand: - Well-healing palmar incision, sutures removed, skin edges well-approximated without erythema or drainage - Composite fist without restriction - 4/5 APB minimal thenar atrophy   Imaging: No results found.   Joesph Dinsmore, OPA-C  "

## 2024-09-17 LAB — DRUG MONITORING, PANEL 8 WITH CONFIRMATION, URINE
6 Acetylmorphine: NEGATIVE ng/mL
Alcohol Metabolites: NEGATIVE ng/mL
Amphetamines: NEGATIVE ng/mL
Benzodiazepines: NEGATIVE ng/mL
Buprenorphine, Urine: NEGATIVE ng/mL
Cocaine Metabolite: NEGATIVE ng/mL
Codeine: NEGATIVE ng/mL
Creatinine: 52.2 mg/dL
Hydrocodone: 253 ng/mL — ABNORMAL HIGH
Hydromorphone: 127 ng/mL — ABNORMAL HIGH
MDMA: NEGATIVE ng/mL
Marijuana Metabolite: NEGATIVE ng/mL
Morphine: NEGATIVE ng/mL
Norhydrocodone: 645 ng/mL — ABNORMAL HIGH
Opiates: POSITIVE ng/mL — AB
Oxidant: NEGATIVE ug/mL
Oxycodone: NEGATIVE ng/mL
pH: 7.7 (ref 4.5–9.0)

## 2024-09-17 LAB — DM TEMPLATE

## 2024-10-02 ENCOUNTER — Encounter: Admitting: Orthopedic Surgery
